# Patient Record
Sex: Male | Born: 1955 | Race: White | Hispanic: No | Marital: Married | Smoking: Never smoker
Health system: Southern US, Community
[De-identification: ages and names within clinical notes are randomized; demographics above are authoritative.]

## PROBLEM LIST (undated history)

## (undated) DIAGNOSIS — R972 Elevated prostate specific antigen [PSA]: Secondary | ICD-10-CM

## (undated) DIAGNOSIS — B009 Herpesviral infection, unspecified: Secondary | ICD-10-CM

## (undated) DIAGNOSIS — Z8719 Personal history of other diseases of the digestive system: Secondary | ICD-10-CM

## (undated) DIAGNOSIS — M5136 Other intervertebral disc degeneration, lumbar region: Secondary | ICD-10-CM

## (undated) DIAGNOSIS — K589 Irritable bowel syndrome without diarrhea: Secondary | ICD-10-CM

## (undated) DIAGNOSIS — N503 Cyst of epididymis: Secondary | ICD-10-CM

## (undated) DIAGNOSIS — T753XXA Motion sickness, initial encounter: Secondary | ICD-10-CM

## (undated) DIAGNOSIS — E785 Hyperlipidemia, unspecified: Secondary | ICD-10-CM

## (undated) DIAGNOSIS — F419 Anxiety disorder, unspecified: Secondary | ICD-10-CM

## (undated) DIAGNOSIS — K219 Gastro-esophageal reflux disease without esophagitis: Secondary | ICD-10-CM

## (undated) DIAGNOSIS — M545 Low back pain, unspecified: Secondary | ICD-10-CM

## (undated) DIAGNOSIS — M51369 Other intervertebral disc degeneration, lumbar region without mention of lumbar back pain or lower extremity pain: Secondary | ICD-10-CM

## (undated) DIAGNOSIS — F410 Panic disorder [episodic paroxysmal anxiety] without agoraphobia: Secondary | ICD-10-CM

## (undated) DIAGNOSIS — K750 Abscess of liver: Secondary | ICD-10-CM

## (undated) DIAGNOSIS — H269 Unspecified cataract: Secondary | ICD-10-CM

## (undated) DIAGNOSIS — Z974 Presence of external hearing-aid: Secondary | ICD-10-CM

## (undated) DIAGNOSIS — E669 Obesity, unspecified: Secondary | ICD-10-CM

## (undated) DIAGNOSIS — T7840XA Allergy, unspecified, initial encounter: Secondary | ICD-10-CM

## (undated) DIAGNOSIS — I10 Essential (primary) hypertension: Secondary | ICD-10-CM

## (undated) HISTORY — DX: Unspecified cataract: H26.9

## (undated) HISTORY — DX: Essential (primary) hypertension: I10

## (undated) HISTORY — PX: EYE SURGERY: SHX253

## (undated) HISTORY — PX: COLONOSCOPY: SHX174

## (undated) HISTORY — DX: Personal history of other diseases of the digestive system: Z87.19

## (undated) HISTORY — DX: Elevated prostate specific antigen (PSA): R97.20

## (undated) HISTORY — PX: ESOPHAGOGASTRODUODENOSCOPY: SHX1529

## (undated) HISTORY — DX: Gastro-esophageal reflux disease without esophagitis: K21.9

## (undated) HISTORY — PX: LIVER CYST REMOVAL: SHX5951

## (undated) HISTORY — DX: Low back pain: M54.5

## (undated) HISTORY — DX: Allergy, unspecified, initial encounter: T78.40XA

## (undated) HISTORY — DX: Obesity, unspecified: E66.9

## (undated) HISTORY — DX: Low back pain, unspecified: M54.50

---
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
## 2004-11-16 ENCOUNTER — Ambulatory Visit: Payer: Self-pay | Admitting: Internal Medicine

## 2004-11-24 ENCOUNTER — Ambulatory Visit: Payer: Self-pay | Admitting: Internal Medicine

## 2007-05-17 ENCOUNTER — Ambulatory Visit: Payer: Self-pay | Admitting: Gastroenterology

## 2007-07-12 ENCOUNTER — Encounter: Payer: Self-pay | Admitting: Family Medicine

## 2008-06-09 LAB — HM COLONOSCOPY

## 2009-01-22 ENCOUNTER — Encounter: Payer: Self-pay | Admitting: Family Medicine

## 2009-09-08 ENCOUNTER — Encounter: Payer: Self-pay | Admitting: Family Medicine

## 2009-10-07 ENCOUNTER — Ambulatory Visit: Payer: Self-pay | Admitting: Family Medicine

## 2009-10-07 DIAGNOSIS — K449 Diaphragmatic hernia without obstruction or gangrene: Secondary | ICD-10-CM | POA: Insufficient documentation

## 2009-10-07 DIAGNOSIS — K589 Irritable bowel syndrome without diarrhea: Secondary | ICD-10-CM | POA: Insufficient documentation

## 2009-10-07 DIAGNOSIS — I10 Essential (primary) hypertension: Secondary | ICD-10-CM | POA: Insufficient documentation

## 2009-10-07 DIAGNOSIS — N508 Other specified disorders of male genital organs: Secondary | ICD-10-CM | POA: Insufficient documentation

## 2009-10-08 LAB — CONVERTED CEMR LAB
ALT: 23 units/L (ref 0–53)
AST: 23 units/L (ref 0–37)
Albumin: 3.7 g/dL (ref 3.5–5.2)
Alkaline Phosphatase: 56 units/L (ref 39–117)
BUN: 20 mg/dL (ref 6–23)
Basophils Absolute: 0 10*3/uL (ref 0.0–0.1)
Basophils Relative: 0.1 % (ref 0.0–3.0)
Bilirubin, Direct: 0 mg/dL (ref 0.0–0.3)
CO2: 28 meq/L (ref 19–32)
Calcium: 8.8 mg/dL (ref 8.4–10.5)
Chloride: 102 meq/L (ref 96–112)
Creatinine, Ser: 1.1 mg/dL (ref 0.4–1.5)
Direct LDL: 123.7 mg/dL
Eosinophils Absolute: 0.2 10*3/uL (ref 0.0–0.7)
Eosinophils Relative: 3.5 % (ref 0.0–5.0)
GFR calc non Af Amer: 73.72 mL/min (ref >60)
Glucose, Bld: 92 mg/dL (ref 70–99)
HCT: 42.8 % (ref 39.0–52.0)
Hemoglobin: 14.6 g/dL (ref 13.0–17.0)
Lymphocytes Relative: 15.6 % (ref 12.0–46.0)
Lymphs Abs: 1 10*3/uL (ref 0.7–4.0)
MCHC: 34.1 g/dL (ref 30.0–36.0)
MCV: 85.6 fL (ref 78.0–100.0)
Monocytes Absolute: 0.5 10*3/uL (ref 0.1–1.0)
Monocytes Relative: 7.6 % (ref 3.0–12.0)
Neutro Abs: 5 10*3/uL (ref 1.4–7.7)
Neutrophils Relative %: 73.2 % (ref 43.0–77.0)
PSA: 2.16 ng/mL (ref 0.10–4.00)
Platelets: 217 10*3/uL (ref 150.0–400.0)
Potassium: 3.8 meq/L (ref 3.5–5.1)
RBC: 5 M/uL (ref 4.22–5.81)
RDW: 13.9 % (ref 11.5–14.6)
Sodium: 137 meq/L (ref 135–145)
TSH: 0.99 microintl units/mL (ref 0.35–5.50)
Total Bilirubin: 0.6 mg/dL (ref 0.3–1.2)
Total Protein: 6.8 g/dL (ref 6.0–8.3)
WBC: 6.7 10*3/uL (ref 4.5–10.5)

## 2009-11-03 ENCOUNTER — Telehealth (INDEPENDENT_AMBULATORY_CARE_PROVIDER_SITE_OTHER): Payer: Self-pay | Admitting: *Deleted

## 2010-09-14 ENCOUNTER — Encounter: Payer: Self-pay | Admitting: Family Medicine

## 2010-10-25 ENCOUNTER — Ambulatory Visit: Payer: Self-pay | Admitting: Internal Medicine

## 2010-10-25 NOTE — Letter (Signed)
Summary: Records Dated 07-10-07 thru 03-31-09/[facility]  Records Dated 07-10-07 thru 03-31-09/[facility]   Imported By: Lanelle Bal 05/26/2010 13:07:00  _____________________________________________________________________  External Attachment:    Type:   Image     Comment:   External Document

## 2010-10-25 NOTE — Assessment & Plan Note (Signed)
Summary: new patient r/s from 12/27/[facility]   Vital Signs:  Patient profile:   55 year old male Height:      66 inches Weight:      237.0 pounds BMI:     38.39 Temp:     98.2 degrees F oral Pulse rate:   80 / minute Pulse rhythm:   regular BP sitting:   140 / 90  (left arm) Cuff size:   regular  Vitals Entered By: Benny Lennert CMA Duncan Dull) (October 07, 2009 8:41 AM)  History of Present Illness: Chief complaint new patient to be established   55 year old male:  Lisinopril - gets a cough. Has ACE cough.  Sees Stefani Dama once a year, PSA was 2.7 this last year. 08/2009.  HTN: off all meds now, needs to restart and change from ACE.   Needs repeat PSA, screening chol, screen for DM.   Otherwise, generally he is feeling good     Preventive Screening-Counseling & Management  Alcohol-Tobacco     Smoking Status: never  Caffeine-Diet-Exercise     Does Patient Exercise: no      Drug Use:  no.    Allergies (verified): 1)  Ace Inhibitors  Past History:  Past Medical History: EPIDIDYMAL CYST  HIATAL HERNIA WITH REFLUX  FAMILY HISTORY OF COLON CA 1ST DEGREE RELATIVE IRRITABLE BOWEL SYNDROME ARTERIOVENOUS MALFORMATIONS      Past Surgical History: tonsils and adenoids 1963  Family History: Family History of Colon CA 1st degree relative <60 Family History High cholesterol Family History Hypertension  Social History: Occupation:Manager Married Never Smoked Alcohol use-no Drug use-no Regular exercise-no Occupation:  employed Smoking Status:  never Drug Use:  no Does Patient Exercise:  no  Review of Systems       ROS: GEN: No acute illnesses, no fevers, chills, sweats, fatigue, weight loss, or URI sx. GI: No n/v/d Pulm: No SOB, cough, wheezing Interactive and getting along well at home.  Otherwise, ROS is as per the HPI.   Physical Exam  Additional Exam:  GEN: WDWN, NAD, Non-toxic, A & O x 3 HEENT: Atraumatic, Normocephalic. Neck supple. No masses,  No LAD. Ears and Nose: No external deformity. CV: RRR, No M/G/R. No JVD. No thrill. No extra heart sounds. PULM: CTA B, no wheezes, crackles, rhonchi. No retractions. No resp. distress. No accessory muscle use. ABD: S, NT, ND, +BS. No rebound tenderness. No HSM.  EXTR: No c/c/e NEURO: Normal gait.  PSYCH: Normally interactive. Conversant. Not depressed or anxious appearing.  Calm demeanor.     Impression & Recommendations:  Problem # 1:  HYPERTENSION, ESSENTIAL NOS (ICD-401.9) Assessment New d/c ace, increase HCTZ  His updated medication list for this problem includes:    Hydrochlorothiazide 25 Mg Tabs (Hydrochlorothiazide) .Marland Kitchen... 1 by mouth daily  Problem # 2:  PROSTATE CANCER SCREEN (ICD-V76.44)  Orders: Venipuncture (43329) TLB-PSA (Prostate Specific Antigen) (84153-PSA)  Problem # 3:  CHOLESTEROL, SCREENING (ICD-V77.91)  Orders: Venipuncture (51884) TLB-Cholesterol, Direct LDL (83721-DIRLDL)  Problem # 4:  FATIGUE-MALAISE (ICD-780.79)  Orders: TLB-BMP (Basic Metabolic Panel-BMET) (80048-METABOL) TLB-CBC Platelet - w/Differential (85025-CBCD) TLB-Hepatic/Liver Function Pnl (80076-HEPATIC) TLB-TSH (Thyroid Stimulating Hormone) (84443-TSH)  Complete Medication List: 1)  Hydrochlorothiazide 25 Mg Tabs (Hydrochlorothiazide) .Marland Kitchen.. 1 by mouth daily 2)  Valacyclovir Hcl 1 Gm Tabs (Valacyclovir hcl) .... As needed for cold sores Prescriptions: HYDROCHLOROTHIAZIDE 25 MG TABS (HYDROCHLOROTHIAZIDE) 1 by mouth daily  #30 x 11   Entered and Authorized by:   Hannah Beat MD   Signed by:  Hannah Beat MD on 10/07/2009   Method used:   Print then Give to Patient   RxID:   [id number]   Current Allergies (reviewed today): ACE INHIBITORS   Flu Vaccine Result Date:  07/26/2009 Flu Vaccine Result:  given Colonoscopy Result Date:  09/25/2006 Colonoscopy Result:  normal

## 2010-10-25 NOTE — Progress Notes (Signed)
Summary: Health History Brought by Patient  Health History Brought by Patient   Imported By: Lanelle Bal 10/13/2009 09:59:37  _____________________________________________________________________  External Attachment:    Type:   Image     Comment:   External Document

## 2010-10-25 NOTE — Letter (Signed)
Summary: [facility]   Imported By: Lanelle Bal 10/11/2009 10:38:04  _____________________________________________________________________  External Attachment:    Type:   Image     Comment:   External Document

## 2010-10-25 NOTE — Progress Notes (Signed)
Summary: regarding pt's bill  Phone Note Call from Patient   Caller: Spouse- Rosey Bath [phone] Summary of Call: Pt was seen on january 13th for a new pt appt, but he also got his physical.  Pt hoped that this would be billed as a wellness exam, and it was not, so pt had to pay his $25.00 co- pay, which he wouldnt have had to pay if billed as a well visit.  Please advise pt's wife as to what is going on with this.  I'm sending this to Dr Patsy Lager because I dont have any knowledge of billing. Initial call taken by: Lowella Petties CMA,  November 03, 2009 1:18 PM  Follow-up for Phone Call        i did not do a physical on him - generally we do not do wellness exams on the initial office visit and much more is required to define and bill a wellness exam. I did not cover much of what is done in a physical.  i changed his blood pressure medication, drew some labwork, but did not do a complete wellness examl, so a new patient offive visit is the appropriate code and v70.0 cannot be billed here.  I am going to forward to [employer], who is our coder to review. I will also forward to Daine Gip, who runs billing for our office. Aram Beecham, if you can explain to patient's wife, I would appreciate it. Follow-up by: Hannah Beat MD,  November 04, 2009 2:59 PM  Additional Follow-up for Phone Call Additional follow up Details #1::        Sp w/ pt, says he is upset that he did not receive what he asked for. Told pt, he was a new pt., the doctor has to review his medical history, and discuss this info with the pt.  Pt feels he should get what he asked for, and because he was not told he would not recieve the cpx, he feels as if we should w/o the $25.00 copay due.  Pts ins pay 100% for a cpx.  Pt said he discussed the ekg and chest xrays w/ Dr. Patsy Lager, says Dr. Patsy Lager told him, he did not need one because of his age. Told pt I would disucss w/ Dr. Patsy Lager .Marland KitchenLorain Childes to [facility] Additional Follow-up by: Daine Gip,  November 15, 2009 1:49 PM    Additional Follow-up for Phone Call Additional follow up Details #2::    I did not do a complete physical.  My billing is correct. This was a new patient visit. All noted above--our coding and billing department is more than welcome to review my note to make sure that I billed this correctly. Please ask them to do so. Follow-up by: Hannah Beat MD,  November 15, 2009 2:00 PM  Additional Follow-up for Phone Call Additional follow up Details #3:: Details for Additional Follow-up Action Taken: I had also spoke to patient and explained information stated above.   I have sent to coding/billing for follow up  and will call patient as well.  Pt had only called a few days ago and is very persistent and demanding regarding this billing issue.   States that he told the office how to code this when he came in for visit that day.  Upon researching, I found out that he has also been in contact with [employer] who had been reviewing with Lateef in coding and that both had already advised patient that documentaiton and charges appeared correct.  I will call pt today.     Additional Follow-up by: Clarisa Schools,  November 15, 2009 4:32 PM

## 2010-10-27 ENCOUNTER — Inpatient Hospital Stay: Payer: Self-pay | Admitting: Internal Medicine

## 2010-10-27 ENCOUNTER — Ambulatory Visit: Payer: Self-pay | Admitting: Internal Medicine

## 2010-10-27 NOTE — Letter (Signed)
Summary: [facility]  [facility]   Imported By: Lanelle Bal 09/22/2010 08:45:47  _____________________________________________________________________  External Attachment:    Type:   Image     Comment:   External Document

## 2010-10-31 LAB — AFP TUMOR MARKER: AFP-Tumor Marker: 1.7 ng/mL (ref 0.0–8.3)

## 2010-10-31 LAB — CEA: CEA: 1.1 ng/mL (ref 0.0–4.7)

## 2010-11-07 ENCOUNTER — Ambulatory Visit: Payer: Self-pay | Admitting: Specialist

## 2010-11-18 ENCOUNTER — Ambulatory Visit: Payer: Self-pay | Admitting: Specialist

## 2011-05-27 DIAGNOSIS — F4322 Adjustment disorder with anxiety: Secondary | ICD-10-CM | POA: Insufficient documentation

## 2011-08-17 IMAGING — CT CT ABDOMEN W/O CM
2 of 3 series · 14 of 42 positions shown, 18 images · non-contrast
Comparison: none

REASON FOR EXAM: cavity gram, liver abscess
COMMENTS:

[Series 2: abd wo 5.0 i40f · axial · 0.97mm/px · z∈[-22,+248]mm · 11 of 63 slices shown, 15 images]
[im 6/63  soft-tissue]
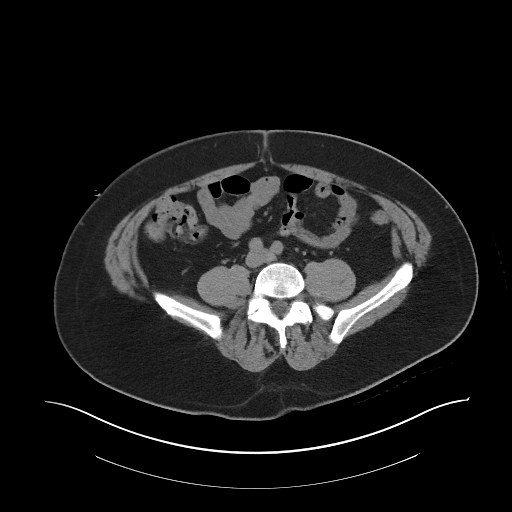
[im 6/63  bone]
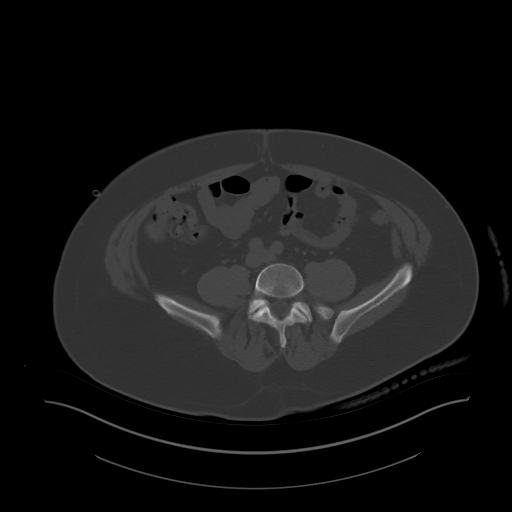
[im 12/63  soft-tissue]
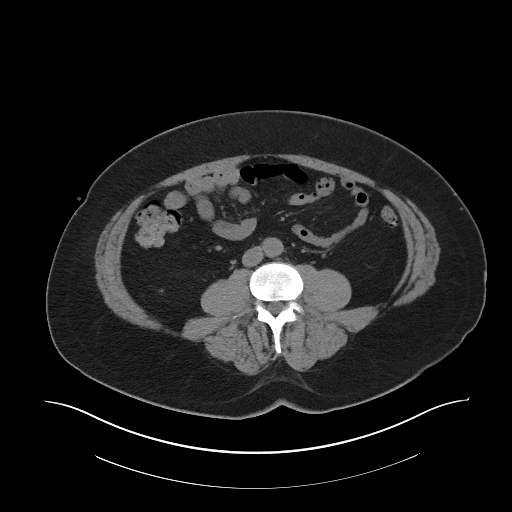
[im 18/63  soft-tissue]
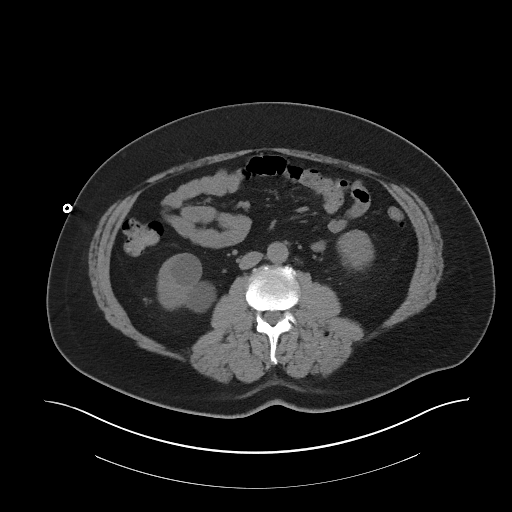
[im 24/63  soft-tissue]
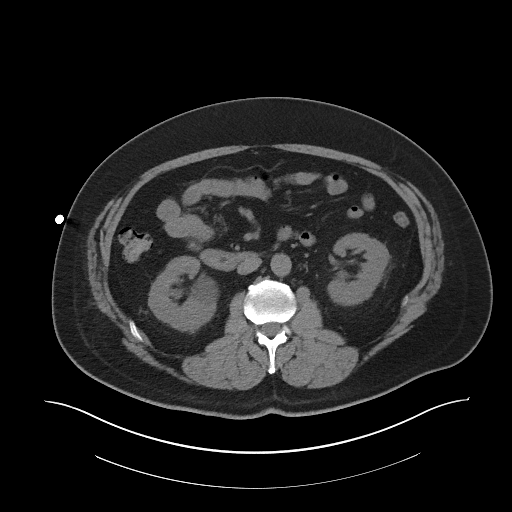
[im 33/63  soft-tissue]
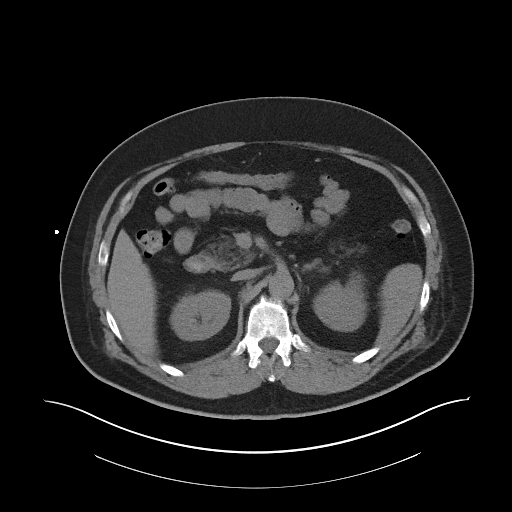
[im 39/63  soft-tissue]
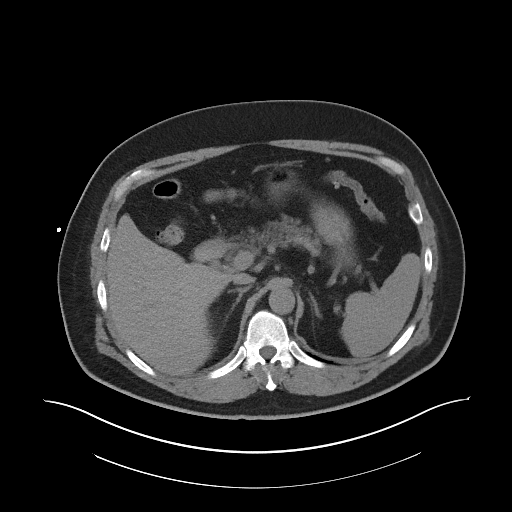
[im 45/63  soft-tissue]
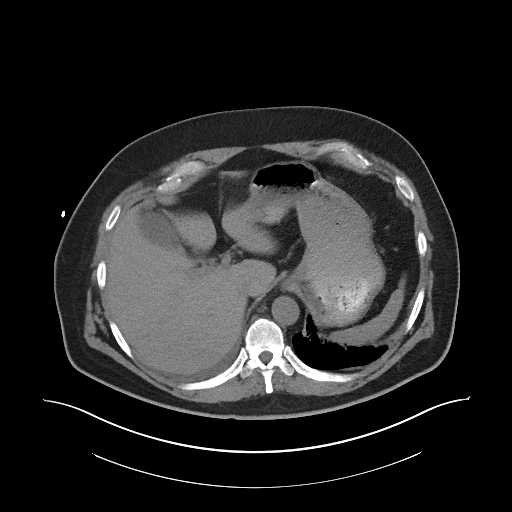
[im 51/63  soft-tissue]
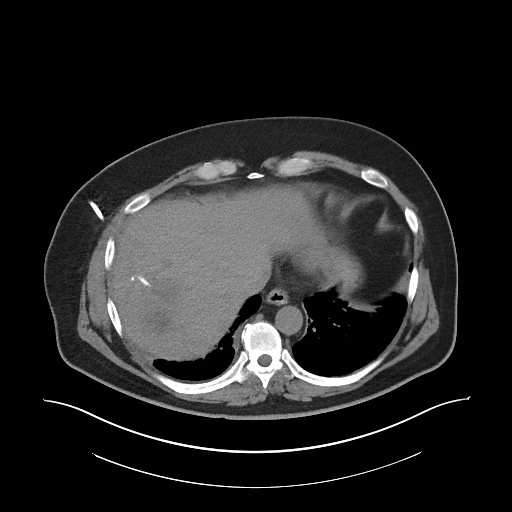
[im 51/63  lung]
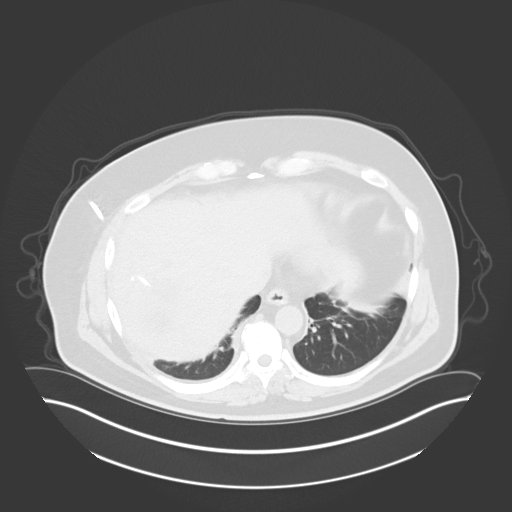
[im 54/63  lung]
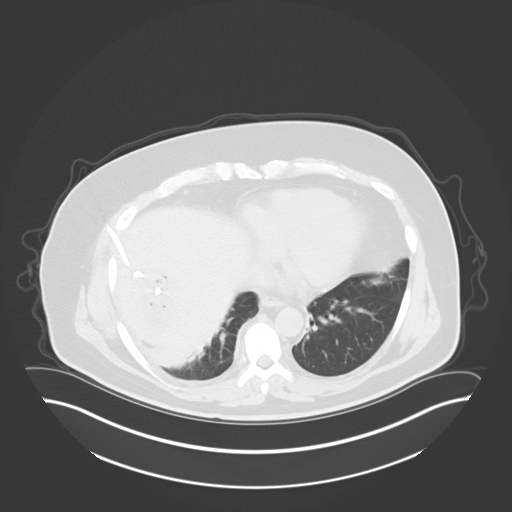
[im 57/63  soft-tissue]
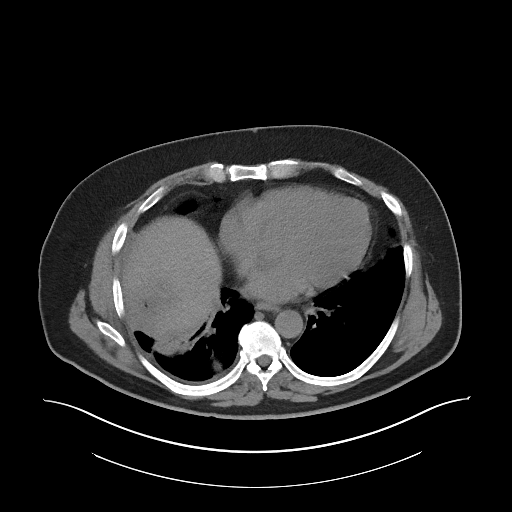
[im 57/63  lung]
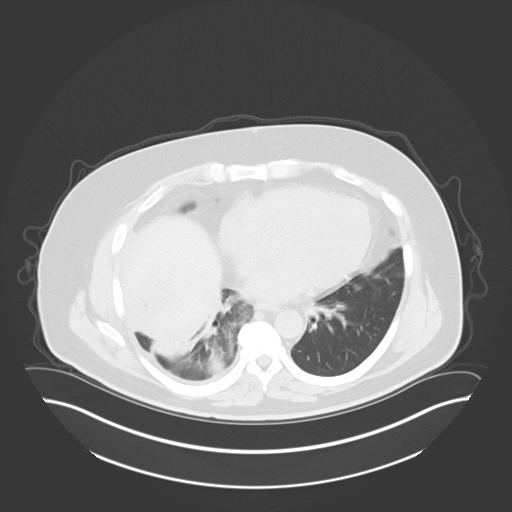
[im 57/63  bone]
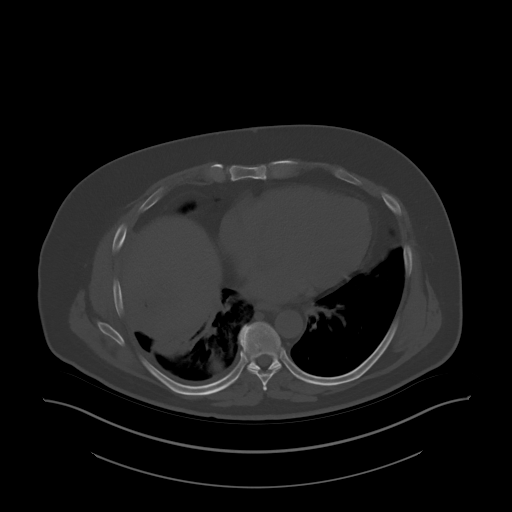
[im 60/63  lung]
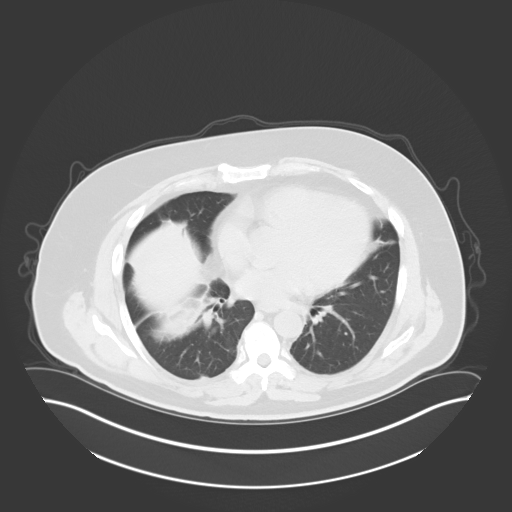

[Series 5: coronals · coronal · 0.65mm/px · 3 of 108 slices shown]
[im 36/108  soft-tissue]
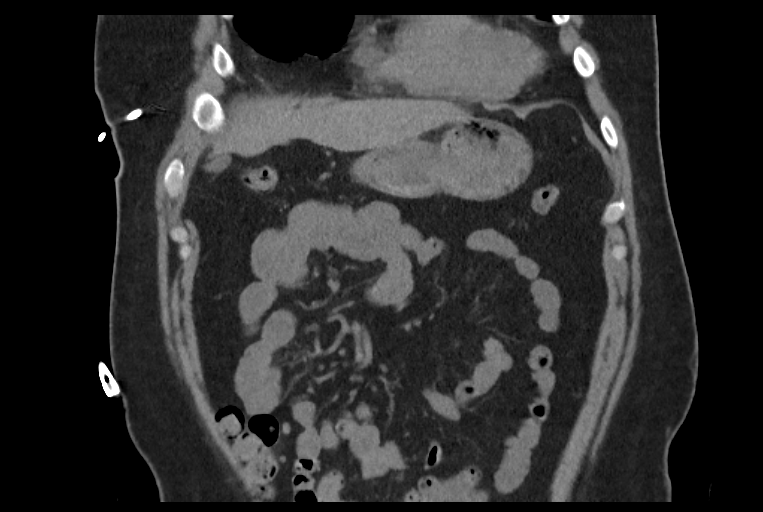
[im 48/108  soft-tissue]
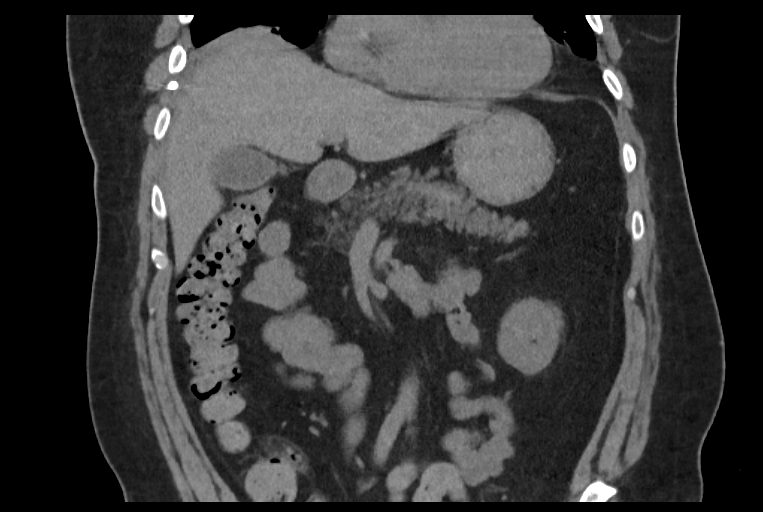
[im 60/108  soft-tissue]
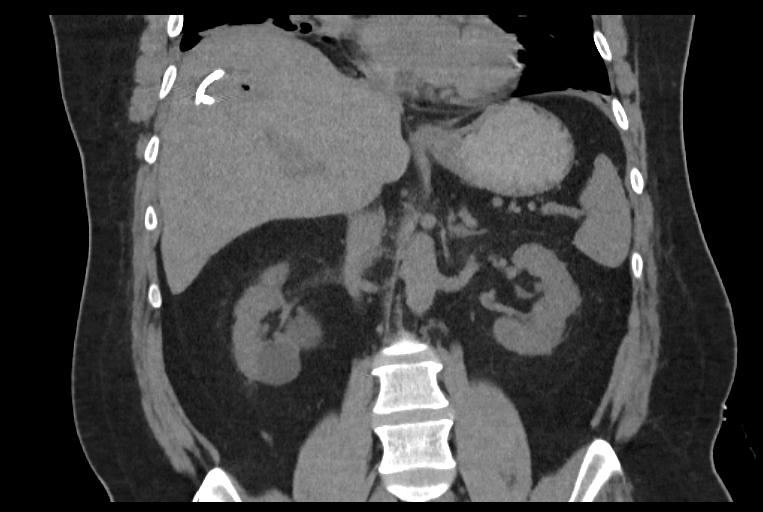

[14 of 42 positions shown; findings below may reference images not displayed]

PROCEDURE:     CT  - CT ABDOMEN STANDARD WO  - November 07, 2010 [DATE]

RESULT:     Standard nonenhanced CT of the liver was obtained pre- and post
saline administration into the liver abscess cavity. A liver abscess cavity
is well drained and has diminished in size from prior initial study of from
10/25/2010. Drainage is clear. The gallbladder is nondistended. The spleen is
normal. The pancreas is unremarkable. Renal cysts are present.
IMPRESSION: Improving liver abscess with good drainage.

## 2011-09-26 HISTORY — PX: PROSTATE BIOPSY: SHX241

## 2011-12-07 DIAGNOSIS — F410 Panic disorder [episodic paroxysmal anxiety] without agoraphobia: Secondary | ICD-10-CM | POA: Insufficient documentation

## 2011-12-07 DIAGNOSIS — E7800 Pure hypercholesterolemia, unspecified: Secondary | ICD-10-CM | POA: Insufficient documentation

## 2012-06-19 DIAGNOSIS — R3129 Other microscopic hematuria: Secondary | ICD-10-CM | POA: Insufficient documentation

## 2012-06-19 DIAGNOSIS — N529 Male erectile dysfunction, unspecified: Secondary | ICD-10-CM | POA: Insufficient documentation

## 2012-06-19 DIAGNOSIS — N4340 Spermatocele of epididymis, unspecified: Secondary | ICD-10-CM | POA: Insufficient documentation

## 2012-06-19 DIAGNOSIS — R972 Elevated prostate specific antigen [PSA]: Secondary | ICD-10-CM | POA: Insufficient documentation

## 2012-07-13 DIAGNOSIS — D400 Neoplasm of uncertain behavior of prostate: Secondary | ICD-10-CM | POA: Insufficient documentation

## 2013-06-05 ENCOUNTER — Encounter: Payer: Self-pay | Admitting: *Deleted

## 2013-06-09 ENCOUNTER — Ambulatory Visit (INDEPENDENT_AMBULATORY_CARE_PROVIDER_SITE_OTHER): Payer: 59 | Admitting: Internal Medicine

## 2013-06-09 ENCOUNTER — Ambulatory Visit: Payer: Self-pay | Admitting: Internal Medicine

## 2013-06-09 ENCOUNTER — Encounter: Payer: Self-pay | Admitting: Internal Medicine

## 2013-06-09 VITALS — BP 130/90 | HR 94 | Temp 98.3°F | Ht 67.0 in | Wt 259.0 lb

## 2013-06-09 DIAGNOSIS — B354 Tinea corporis: Secondary | ICD-10-CM

## 2013-06-09 DIAGNOSIS — E785 Hyperlipidemia, unspecified: Secondary | ICD-10-CM

## 2013-06-09 DIAGNOSIS — I10 Essential (primary) hypertension: Secondary | ICD-10-CM

## 2013-06-09 LAB — COMPREHENSIVE METABOLIC PANEL
ALT: 21 U/L (ref 0–53)
AST: 17 U/L (ref 0–37)
Albumin: 3.8 g/dL (ref 3.5–5.2)
Alkaline Phosphatase: 63 U/L (ref 39–117)
BUN: 22 mg/dL (ref 6–23)
CO2: 27 mEq/L (ref 19–32)
Calcium: 9.2 mg/dL (ref 8.4–10.5)
Chloride: 105 mEq/L (ref 96–112)
Creatinine, Ser: 1.2 mg/dL (ref 0.4–1.5)
GFR: 68.44 mL/min (ref >60.00)
Glucose, Bld: 91 mg/dL (ref 70–99)
Potassium: 3.6 mEq/L (ref 3.5–5.1)
Sodium: 138 mEq/L (ref 135–145)
Total Bilirubin: 0.6 mg/dL (ref 0.3–1.2)
Total Protein: 7 g/dL (ref 6.0–8.3)

## 2013-06-09 LAB — LIPID PANEL
Cholesterol: 195 mg/dL (ref 0–200)
HDL: 44.1 mg/dL (ref >39.00)
LDL Cholesterol: 128 mg/dL — ABNORMAL HIGH (ref 0–99)
Total CHOL/HDL Ratio: 4
Triglycerides: 116 mg/dL (ref 0.0–149.0)
VLDL: 23.2 mg/dL (ref 0.0–40.0)

## 2013-06-09 LAB — MICROALBUMIN / CREATININE URINE RATIO
Creatinine,U: 217.5 mg/dL
Microalb Creat Ratio: 0.2 mg/g (ref 0.0–30.0)
Microalb, Ur: 0.4 mg/dL (ref 0.0–1.9)

## 2013-06-09 MED ORDER — LOSARTAN POTASSIUM-HCTZ 100-25 MG PO TABS: 1.0000 | ORAL_TABLET | Freq: Every day | ORAL | Status: DC

## 2013-06-09 MED ORDER — VALACYCLOVIR HCL 500 MG PO TABS: 500.0000 mg | ORAL_TABLET | Freq: Two times a day (BID) | ORAL | Status: DC | PRN

## 2013-06-09 MED ORDER — CYCLOBENZAPRINE HCL 10 MG PO TABS: 10.0000 mg | ORAL_TABLET | Freq: Three times a day (TID) | ORAL | Status: DC | PRN

## 2013-06-09 MED ORDER — AMLODIPINE BESYLATE 5 MG PO TABS: 5.0000 mg | ORAL_TABLET | Freq: Every day | ORAL | Status: DC

## 2013-06-09 MED ORDER — DICLOFENAC SODIUM 3 % TD GEL: 1.0000 "application " | TRANSDERMAL | Status: DC | PRN

## 2013-06-09 NOTE — Progress Notes (Signed)
Subjective:    Patient ID: Scott Crawford, male    DOB: 1956/08/14, 57 y.o.   MRN: [id number]  HPI 57 year old male with history of hypertension, obesity presents to establish care. He reports that he is generally feeling well. He is compliant with medication. He denies any recent chest pain, palpitations, headache. He tries to follow a healthy diet. He does not get regular exercise as he has limited time. He is the primary caregiver for his wife who is disabled from a Wegener's granulomatosis. He retired from his job in order to care for her.  His only concern today is several week history of scaling rash over his left flank. He is scheduled to followup with his dermatologist in a few weeks. The rash is not generally itchy. He denies use of any new lotions or creams. He denies use of any new soaps or perfumes.  Outpatient Encounter Prescriptions as of 06/09/2013  Medication Sig Dispense Refill  . amLODipine (NORVASC) 5 MG tablet Take 1 tablet (5 mg total) by mouth daily.  30 tablet  11  . cyclobenzaprine (FLEXERIL) 10 MG tablet Take 1 tablet (10 mg total) by mouth 3 (three) times daily as needed for muscle spasms.  90 tablet  3  . Diclofenac Sodium (SOLARAZE) 3 % GEL Place 1 application onto the skin as needed.  100 g  6  . finasteride (PROSCAR) 5 MG tablet       . losartan-hydrochlorothiazide (HYZAAR) 100-25 MG per tablet Take 1 tablet by mouth daily.  30 tablet  11  . valACYclovir (VALTREX) 500 MG tablet Take 1 tablet (500 mg total) by mouth 2 (two) times daily as needed (As needed for cold sores).  30 tablet  11   No facility-administered encounter medications on file as of 06/09/2013.   BP 130/90  Pulse 94  Temp(Src) 98.3 F (36.8 C) (Oral)  Ht 5\' 7"  (1.702 m)  Wt 259 lb (117.482 kg)  BMI 40.56 kg/m2  SpO2 95%  Review of Systems  Constitutional: Negative for fever, chills, activity change, appetite change, fatigue and unexpected weight change.  Eyes: Negative for visual  disturbance.  Respiratory: Negative for cough and shortness of breath.   Cardiovascular: Negative for chest pain, palpitations and leg swelling.  Gastrointestinal: Negative for abdominal pain and abdominal distention.  Genitourinary: Negative for dysuria, urgency and difficulty urinating.  Musculoskeletal: Negative for arthralgias and gait problem.  Skin: Positive for rash. Negative for color change.  Hematological: Negative for adenopathy.  Psychiatric/Behavioral: Negative for sleep disturbance and dysphoric mood. The patient is not nervous/anxious.        Objective:   Physical Exam  Constitutional: He is oriented to person, place, and time. He appears well-developed and well-nourished. No distress.  HENT:  Head: Normocephalic and atraumatic.  Right Ear: External ear normal.  Left Ear: External ear normal.  Nose: Nose normal.  Mouth/Throat: Oropharynx is clear and moist. No oropharyngeal exudate.  Eyes: Conjunctivae and EOM are normal. Pupils are equal, round, and reactive to light. Right eye exhibits no discharge. Left eye exhibits no discharge. No scleral icterus.  Neck: Normal range of motion. Neck supple. No tracheal deviation present. No thyromegaly present.  Cardiovascular: Normal rate, regular rhythm and normal heart sounds.  Exam reveals no gallop and no friction rub.   No murmur heard. Pulmonary/Chest: Effort normal and breath sounds normal. No respiratory distress. He has no wheezes. He has no rales. He exhibits no tenderness.  Abdominal: Soft. Bowel sounds are normal. He exhibits  no distension and no mass. There is no tenderness. There is no rebound and no guarding.  Musculoskeletal: Normal range of motion. He exhibits no edema.  Lymphadenopathy:    He has no cervical adenopathy.  Neurological: He is alert and oriented to person, place, and time. No cranial nerve deficit. Coordination normal.  Skin: Skin is warm and dry. Rash noted. Rash is maculopapular. He is not  diaphoretic. No erythema. No pallor.     Psychiatric: He has a normal mood and affect. His behavior is normal. Judgment and thought content normal.          Assessment & Plan:

## 2013-06-09 NOTE — Assessment & Plan Note (Signed)
Rash is most consistent with tinea. Recommended applying Lotrimin cream twice daily for the next couple of weeks. He has followup scheduled with his dermatologist if symptoms are not improving.

## 2013-06-09 NOTE — Assessment & Plan Note (Signed)
BP Readings from Last 3 Encounters:  06/09/13 130/90  10/07/09 140/90   Blood pressure generally well controlled at home, diastolic slightly elevated today. We'll continue to monitor. Patient will call if blood pressure consistently greater than 140/90. Will check renal function with labs today. Follow up 6 months or sooner as needed.

## 2013-06-09 NOTE — Assessment & Plan Note (Signed)
Wt Readings from Last 3 Encounters:  06/09/13 259 lb (117.482 kg)  10/07/09 237 lb (107.502 kg)   Body mass index is 40.56 kg/(m^2).  Encouraged continued efforts at healthy diet. Encouraged regular physical activity as much is possible understanding limitations given that he is the primary caregiver for his wife.

## 2013-06-11 ENCOUNTER — Encounter: Payer: Self-pay | Admitting: Internal Medicine

## 2013-07-28 ENCOUNTER — Ambulatory Visit: Payer: 59

## 2013-11-26 ENCOUNTER — Encounter: Payer: Self-pay | Admitting: *Deleted

## 2013-12-09 ENCOUNTER — Encounter: Payer: 59 | Admitting: Internal Medicine

## 2013-12-12 ENCOUNTER — Ambulatory Visit (INDEPENDENT_AMBULATORY_CARE_PROVIDER_SITE_OTHER): Payer: 59 | Admitting: Internal Medicine

## 2013-12-12 ENCOUNTER — Encounter: Payer: Self-pay | Admitting: Internal Medicine

## 2013-12-12 VITALS — BP 102/72 | HR 93 | Temp 98.1°F | Ht 67.0 in | Wt 261.0 lb

## 2013-12-12 DIAGNOSIS — Z0000 Encounter for general adult medical examination without abnormal findings: Secondary | ICD-10-CM

## 2013-12-12 DIAGNOSIS — I10 Essential (primary) hypertension: Secondary | ICD-10-CM

## 2013-12-12 DIAGNOSIS — Z23 Encounter for immunization: Secondary | ICD-10-CM

## 2013-12-12 LAB — LIPID PANEL
Cholesterol: 202 mg/dL — ABNORMAL HIGH (ref 0–200)
HDL: 35.6 mg/dL — ABNORMAL LOW (ref >39.00)
LDL Cholesterol: 121 mg/dL — ABNORMAL HIGH (ref 0–99)
Total CHOL/HDL Ratio: 6
Triglycerides: 227 mg/dL — ABNORMAL HIGH (ref 0.0–149.0)
VLDL: 45.4 mg/dL — ABNORMAL HIGH (ref 0.0–40.0)

## 2013-12-12 LAB — COMPREHENSIVE METABOLIC PANEL
ALT: 22 U/L (ref 0–53)
AST: 17 U/L (ref 0–37)
Albumin: 3.9 g/dL (ref 3.5–5.2)
Alkaline Phosphatase: 72 U/L (ref 39–117)
BUN: 22 mg/dL (ref 6–23)
CO2: 27 mEq/L (ref 19–32)
Calcium: 9.6 mg/dL (ref 8.4–10.5)
Chloride: 104 mEq/L (ref 96–112)
Creatinine, Ser: 1.2 mg/dL (ref 0.4–1.5)
GFR: 65.08 mL/min (ref >60.00)
Glucose, Bld: 90 mg/dL (ref 70–99)
Potassium: 3.7 mEq/L (ref 3.5–5.1)
Sodium: 141 mEq/L (ref 135–145)
Total Bilirubin: 0.5 mg/dL (ref 0.3–1.2)
Total Protein: 7.1 g/dL (ref 6.0–8.3)

## 2013-12-12 LAB — CBC WITH DIFFERENTIAL/PLATELET
Basophils Absolute: 0.1 10*3/uL (ref 0.0–0.1)
Basophils Relative: 0.5 % (ref 0.0–3.0)
Eosinophils Absolute: 0.3 10*3/uL (ref 0.0–0.7)
Eosinophils Relative: 3.6 % (ref 0.0–5.0)
HCT: 43.7 % (ref 39.0–52.0)
Hemoglobin: 14.3 g/dL (ref 13.0–17.0)
Lymphocytes Relative: 16.8 % (ref 12.0–46.0)
Lymphs Abs: 1.6 10*3/uL (ref 0.7–4.0)
MCHC: 32.8 g/dL (ref 30.0–36.0)
MCV: 84.5 fl (ref 78.0–100.0)
Monocytes Absolute: 0.9 10*3/uL (ref 0.1–1.0)
Monocytes Relative: 8.9 % (ref 3.0–12.0)
Neutro Abs: 6.9 10*3/uL (ref 1.4–7.7)
Neutrophils Relative %: 70.2 % (ref 43.0–77.0)
Platelets: 329 10*3/uL (ref 150.0–400.0)
RBC: 5.17 Mil/uL (ref 4.22–5.81)
RDW: 15.1 % — ABNORMAL HIGH (ref 11.5–14.6)
WBC: 9.8 10*3/uL (ref 4.5–10.5)

## 2013-12-12 LAB — MICROALBUMIN / CREATININE URINE RATIO
Creatinine,U: 180.8 mg/dL
Microalb Creat Ratio: 0.2 mg/g (ref 0.0–30.0)
Microalb, Ur: 0.3 mg/dL (ref 0.0–1.9)

## 2013-12-12 NOTE — Progress Notes (Signed)
Subjective:    Patient ID: Scott Crawford, male    DOB: 14-Jul-1956, 58 y.o.   MRN: [id number]  HPI 58YO male presents for annual exam. He is generally feeling well. No concerns today. He would like to get Pneumovax vaccine today, as he had Prevnar last year. He continues to care for his wife, who has chronic illness. He notes limited time for exercise. He tries to follow a healthy diet.  Outpatient Encounter Prescriptions as of 12/12/2013  Medication Sig  . amLODipine (NORVASC) 5 MG tablet Take 1 tablet (5 mg total) by mouth daily.  . cyclobenzaprine (FLEXERIL) 10 MG tablet Take 1 tablet (10 mg total) by mouth 3 (three) times daily as needed for muscle spasms.  . Diclofenac Sodium (SOLARAZE) 3 % GEL Place 1 application onto the skin as needed.  . finasteride (PROSCAR) 5 MG tablet   . losartan-hydrochlorothiazide (HYZAAR) 100-25 MG per tablet Take 1 tablet by mouth daily.  . valACYclovir (VALTREX) 500 MG tablet Take 1 tablet (500 mg total) by mouth 2 (two) times daily as needed (As needed for cold sores).     Review of Systems  Constitutional: Negative for fever, chills, activity change, appetite change, fatigue and unexpected weight change.  Eyes: Negative for visual disturbance.  Respiratory: Negative for cough and shortness of breath.   Cardiovascular: Negative for chest pain, palpitations and leg swelling.  Gastrointestinal: Negative for abdominal pain and abdominal distention.  Genitourinary: Negative for dysuria, urgency and difficulty urinating.  Musculoskeletal: Negative for arthralgias and gait problem.  Skin: Negative for color change and rash.  Hematological: Negative for adenopathy.  Psychiatric/Behavioral: Negative for sleep disturbance and dysphoric mood. The patient is not nervous/anxious.        Objective:    BP 102/72  Pulse 93  Temp(Src) 98.1 F (36.7 C) (Oral)  Ht 5\' 7"  (1.702 m)  Wt 261 lb (118.389 kg)  BMI 40.87 kg/m2  SpO2 97% Physical Exam    Constitutional: He is oriented to person, place, and time. He appears well-developed and well-nourished. No distress.  HENT:  Head: Normocephalic and atraumatic.  Right Ear: External ear normal.  Left Ear: External ear normal.  Nose: Nose normal.  Mouth/Throat: Oropharynx is clear and moist. No oropharyngeal exudate.  Eyes: Conjunctivae and EOM are normal. Pupils are equal, round, and reactive to light. Right eye exhibits no discharge. Left eye exhibits no discharge. No scleral icterus.  Neck: Normal range of motion. Neck supple. No tracheal deviation present. No thyromegaly present.  Cardiovascular: Normal rate, regular rhythm and normal heart sounds.  Exam reveals no gallop and no friction rub.   No murmur heard. Pulmonary/Chest: Effort normal and breath sounds normal. No accessory muscle usage. Not tachypneic. No respiratory distress. He has no decreased breath sounds. He has no wheezes. He has no rhonchi. He has no rales. He exhibits no tenderness.  Abdominal: Soft. Bowel sounds are normal. He exhibits no distension and no mass. There is no tenderness. There is no rebound and no guarding.  Musculoskeletal: Normal range of motion. He exhibits no edema.  Lymphadenopathy:    He has no cervical adenopathy.  Neurological: He is alert and oriented to person, place, and time. No cranial nerve deficit. Coordination normal.  Skin: Skin is warm and dry. No rash noted. He is not diaphoretic. No erythema. No pallor.  Psychiatric: He has a normal mood and affect. His behavior is normal. Judgment and thought content normal.  Assessment & Plan:   Problem List Items Addressed This Visit   HYPERTENSION, ESSENTIAL NOS      BP Readings from Last 3 Encounters:  12/12/13 102/72  06/09/13 130/90  10/07/09 140/90   BP well controlled on current medications. Will continue.    Routine general medical examination at a health care facility - Primary     General medical exam normal today.  Colonoscopy is UTD. Recent PSA testing normal. Immunizations are UTD except for Pneumovax and Tdap which were given today. Will check labs including CBC, CMP, lipids, urine microalbumin. Encouraged healthy diet and regular exercise.    Relevant Orders      CBC with Differential (Completed)      Comprehensive metabolic panel (Completed)      Lipid panel (Completed)      Microalbumin / creatinine urine ratio (Completed)      Vit D  25 hydroxy (rtn osteoporosis monitoring)      Fecal occult blood, imunochemical      Measles ([place]) Antibody IgG   Severe obesity (BMI >= 40)      Wt Readings from Last 3 Encounters:  12/12/13 261 lb (118.389 kg)  06/09/13 259 lb (117.482 kg)  10/07/09 237 lb (107.502 kg)   Encouraged healthy diet and regular exercise with goal of weight loss.     Other Visit Diagnoses   Need for prophylactic vaccination against Streptococcus pneumoniae (pneumococcus)        Relevant Orders       Pneumococcal polysaccharide vaccine 23-valent greater than or equal to 2yo subcutaneous/IM (Completed)       Varicella zoster antibody, IgG       Varicella zoster antibody, IgM    Need for diphtheria-tetanus-pertussis (Tdap) vaccine, adult/adolescent        Relevant Orders       Tdap vaccine greater than or equal to 7yo IM (Completed)        Return in about 6 months (around 06/14/2014) for Recheck.

## 2013-12-12 NOTE — Assessment & Plan Note (Signed)
General medical exam normal today. Colonoscopy is UTD. Recent PSA testing normal. Immunizations are UTD except for Pneumovax and Tdap which were given today. Will check labs including CBC, CMP, lipids, urine microalbumin. Encouraged healthy diet and regular exercise.

## 2013-12-12 NOTE — Progress Notes (Signed)
Pre visit review using our clinic review tool, if applicable. No additional management support is needed unless otherwise documented below in the visit note. 

## 2013-12-12 NOTE — Assessment & Plan Note (Signed)
Wt Readings from Last 3 Encounters:  12/12/13 261 lb (118.389 kg)  06/09/13 259 lb (117.482 kg)  10/07/09 237 lb (107.502 kg)   Encouraged healthy diet and regular exercise with goal of weight loss.

## 2013-12-12 NOTE — Assessment & Plan Note (Signed)
BP Readings from Last 3 Encounters:  12/12/13 102/72  06/09/13 130/90  10/07/09 140/90   BP well controlled on current medications. Will continue.

## 2013-12-12 NOTE — Patient Instructions (Signed)
Look into Cologuard coverage

## 2013-12-13 LAB — VITAMIN D 25 HYDROXY (VIT D DEFICIENCY, FRACTURES): Vit D, 25-Hydroxy: 38 ng/mL (ref 30–89)

## 2013-12-13 LAB — VARICELLA ZOSTER ANTIBODY, IGG: Varicella IgG: 1181 Index — ABNORMAL HIGH (ref <135.00)

## 2013-12-13 LAB — RUBEOLA ANTIBODY IGG: Rubeola IgG: >300 AU/mL — ABNORMAL HIGH (ref <25.00)

## 2013-12-15 ENCOUNTER — Telehealth: Payer: Self-pay | Admitting: *Deleted

## 2013-12-15 ENCOUNTER — Telehealth: Payer: Self-pay | Admitting: Internal Medicine

## 2013-12-15 NOTE — Telephone Encounter (Signed)
Relevant patient education assigned to patient using Emmi. ° °

## 2013-12-15 NOTE — Telephone Encounter (Signed)
Patient left voicemail stating he did contact his insurance company in reference to the [employer]. In order for them to proceed any further he will need diagnosis code to provide to the insurance company.

## 2013-12-15 NOTE — Telephone Encounter (Signed)
Yes, the form does not have the CPT code, so I forwarded the message to [facility] to figure out the CPT coding.

## 2013-12-16 LAB — VARICELLA ZOSTER ANTIBODY, IGM: Varicella Zoster Ab IgM: 0.09 {ISR} (ref <0.91)

## 2013-12-17 ENCOUNTER — Encounter: Payer: Self-pay | Admitting: Internal Medicine

## 2014-01-01 ENCOUNTER — Telehealth: Payer: Self-pay | Admitting: Internal Medicine

## 2014-01-01 NOTE — Telephone Encounter (Signed)
Please call pt he is upset that it has taken so long to get this information  From [name], MD [[id number]]  To [name] Composed 12/17/2013 9:10 PM  For Delivery On 12/17/2013 9:10 PM  Subject RE: Non-Urgent Medical Question Message Type  Patient Medical Advice Request Read Status Y By [name] - last viewed at 10:35 PM on 12/17/2013  Message Body Yes, I have asked our office manager to try to get the CPT code from the company. She will contact you with this as soon as she has it.   ----- Message -----  From: [name]  Sent: 12/17/2013 9:50 AM EDT  To: [name], MD  Subject: Non-Urgent Medical Question   Second request for info..   To get approval for the Cologuard test I need to provide the insurance company with the procedure code..   Thanks, Scott Crawford

## 2014-01-02 NOTE — Telephone Encounter (Signed)
Per Manuela Schwartz the CPT for Cologuard is [phone]. Patient is aware of the code. He did say that his insurance company has tried contacting our office in regards to this code. I don't have any messages from them. I did apologize to the patient for it taken so long to get this information for him. He said thank you and he was glad that he finally had the code.

## 2014-06-13 ENCOUNTER — Other Ambulatory Visit: Payer: Self-pay | Admitting: Internal Medicine

## 2014-06-18 ENCOUNTER — Ambulatory Visit (INDEPENDENT_AMBULATORY_CARE_PROVIDER_SITE_OTHER): Payer: 59 | Admitting: Internal Medicine

## 2014-06-18 ENCOUNTER — Encounter: Payer: Self-pay | Admitting: Internal Medicine

## 2014-06-18 VITALS — BP 116/74 | HR 82 | Temp 98.4°F | Ht 67.0 in | Wt 254.5 lb

## 2014-06-18 DIAGNOSIS — I10 Essential (primary) hypertension: Secondary | ICD-10-CM

## 2014-06-18 LAB — COMPREHENSIVE METABOLIC PANEL
ALT: 17 U/L (ref 0–53)
AST: 17 U/L (ref 0–37)
Albumin: 3.9 g/dL (ref 3.5–5.2)
Alkaline Phosphatase: 73 U/L (ref 39–117)
BUN: 27 mg/dL — ABNORMAL HIGH (ref 6–23)
CO2: 31 mEq/L (ref 19–32)
Calcium: 9.4 mg/dL (ref 8.4–10.5)
Chloride: 103 mEq/L (ref 96–112)
Creatinine, Ser: 1.2 mg/dL (ref 0.4–1.5)
GFR: 64.35 mL/min (ref >60.00)
Glucose, Bld: 104 mg/dL — ABNORMAL HIGH (ref 70–99)
Potassium: 3.6 mEq/L (ref 3.5–5.1)
Sodium: 139 mEq/L (ref 135–145)
Total Bilirubin: 0.3 mg/dL (ref 0.2–1.2)
Total Protein: 7.4 g/dL (ref 6.0–8.3)

## 2014-06-18 MED ORDER — AMLODIPINE BESYLATE 5 MG PO TABS: ORAL_TABLET | ORAL | Status: DC

## 2014-06-18 MED ORDER — LOSARTAN POTASSIUM-HCTZ 100-25 MG PO TABS: ORAL_TABLET | ORAL | Status: DC

## 2014-06-18 NOTE — Assessment & Plan Note (Signed)
BP Readings from Last 3 Encounters:  06/18/14 116/74  12/12/13 102/72  06/09/13 130/90   BP well controlled. Will try tapering down on Amlodipine to 2.5mg  daily. He will call or email with BP readings. Renal function with labs today.

## 2014-06-18 NOTE — Progress Notes (Signed)
Pre visit review using our clinic review tool, if applicable. No additional management support is needed unless otherwise documented below in the visit note. 

## 2014-06-18 NOTE — Assessment & Plan Note (Signed)
Wt Readings from Last 3 Encounters:  06/18/14 254 lb 8 oz (115.44 kg)  12/12/13 261 lb (118.389 kg)  06/09/13 259 lb (117.482 kg)   Body mass index is 39.85 kg/(m^2). Encouraged continued effort at weight loss with healthy diet and exercise.

## 2014-06-18 NOTE — Progress Notes (Signed)
Subjective:    Patient ID: Scott Crawford, male    DOB: 1955-12-23, 58 y.o.   MRN: [id number]  HPI 58YO male presents for follow up. Feeling well. Has been limiting portion size in effort to lose weight. Has lost 7lbs. BP typically near 100/70s recently. Compliant with medication. Had one fleeting episode of LLQ abdominal pain a few weeks ago after eating goat cheese, however no recurrent symptoms.  Review of Systems  Constitutional: Negative for fever, chills, activity change, appetite change, fatigue and unexpected weight change.  Eyes: Negative for visual disturbance.  Respiratory: Negative for cough and shortness of breath.   Cardiovascular: Negative for chest pain, palpitations and leg swelling.  Gastrointestinal: Negative for nausea, vomiting, abdominal pain, diarrhea, constipation and abdominal distention.  Genitourinary: Negative for dysuria, urgency and difficulty urinating.  Musculoskeletal: Negative for arthralgias, gait problem, myalgias and neck pain.  Skin: Negative for color change and rash.  Hematological: Negative for adenopathy.  Psychiatric/Behavioral: Negative for sleep disturbance and dysphoric mood. The patient is not nervous/anxious.        Objective:    BP 116/74  Pulse 82  Temp(Src) 98.4 F (36.9 C) (Oral)  Ht 5\' 7"  (1.702 m)  Wt 254 lb 8 oz (115.44 kg)  BMI 39.85 kg/m2  SpO2 97% Physical Exam  Constitutional: He is oriented to person, place, and time. He appears well-developed and well-nourished. No distress.  HENT:  Head: Normocephalic and atraumatic.  Right Ear: External ear normal.  Left Ear: External ear normal.  Nose: Nose normal.  Mouth/Throat: Oropharynx is clear and moist. No oropharyngeal exudate.  Eyes: Conjunctivae and EOM are normal. Pupils are equal, round, and reactive to light. Right eye exhibits no discharge. Left eye exhibits no discharge. No scleral icterus.  Neck: Normal range of motion. Neck supple. No tracheal deviation  present. No thyromegaly present.  Cardiovascular: Normal rate, regular rhythm and normal heart sounds.  Exam reveals no gallop and no friction rub.   No murmur heard. Pulmonary/Chest: Effort normal and breath sounds normal. No accessory muscle usage. Not tachypneic. No respiratory distress. He has no decreased breath sounds. He has no wheezes. He has no rhonchi. He has no rales. He exhibits no tenderness.  Abdominal: Soft. Bowel sounds are normal. He exhibits no distension and no mass. There is no tenderness. There is no rebound and no guarding.  Musculoskeletal: Normal range of motion. He exhibits no edema.  Lymphadenopathy:    He has no cervical adenopathy.  Neurological: He is alert and oriented to person, place, and time. No cranial nerve deficit. Coordination normal.  Skin: Skin is warm and dry. No rash noted. He is not diaphoretic. No erythema. No pallor.  Psychiatric: He has a normal mood and affect. His behavior is normal. Judgment and thought content normal.          Assessment & Plan:   Problem List Items Addressed This Visit     Unprioritized   HYPERTENSION, ESSENTIAL NOS - Primary      BP Readings from Last 3 Encounters:  06/18/14 116/74  12/12/13 102/72  06/09/13 130/90   BP well controlled. Will try tapering down on Amlodipine to 2.5mg  daily. He will call or email with BP readings. Renal function with labs today.    Relevant Medications      losartan-hydrochlorothiazide (HYZAAR) 100-25 MG per tablet      amLODIpine (NORVASC) tablet   Other Relevant Orders      Comprehensive metabolic panel   Severe obesity (BMI >=  40)      Wt Readings from Last 3 Encounters:  06/18/14 254 lb 8 oz (115.44 kg)  12/12/13 261 lb (118.389 kg)  06/09/13 259 lb (117.482 kg)   Body mass index is 39.85 kg/(m^2). Encouraged continued effort at weight loss with healthy diet and exercise.        Return in about 6 months (around 12/17/2014) for Wellness Visit.

## 2014-06-18 NOTE — Patient Instructions (Addendum)
Try reducing dose of Amlodipine to 2.5mg  daily. Monitor blood pressure and email with readings.

## 2014-06-24 ENCOUNTER — Ambulatory Visit: Payer: Self-pay | Admitting: Ophthalmology

## 2014-07-10 ENCOUNTER — Other Ambulatory Visit: Payer: Self-pay

## 2014-12-18 ENCOUNTER — Encounter: Payer: 59 | Admitting: Internal Medicine

## 2015-02-25 ENCOUNTER — Encounter: Payer: Self-pay | Admitting: Internal Medicine

## 2015-02-25 ENCOUNTER — Ambulatory Visit (INDEPENDENT_AMBULATORY_CARE_PROVIDER_SITE_OTHER): Payer: BLUE CROSS/BLUE SHIELD | Admitting: Internal Medicine

## 2015-02-25 VITALS — BP 108/69 | HR 71 | Temp 98.2°F | Ht 67.25 in | Wt 237.0 lb

## 2015-02-25 DIAGNOSIS — Z0000 Encounter for general adult medical examination without abnormal findings: Secondary | ICD-10-CM

## 2015-02-25 DIAGNOSIS — Z800 Family history of malignant neoplasm of digestive organs: Secondary | ICD-10-CM | POA: Diagnosis not present

## 2015-02-25 DIAGNOSIS — I10 Essential (primary) hypertension: Secondary | ICD-10-CM | POA: Diagnosis not present

## 2015-02-25 DIAGNOSIS — E669 Obesity, unspecified: Secondary | ICD-10-CM | POA: Diagnosis not present

## 2015-02-25 LAB — COMPREHENSIVE METABOLIC PANEL
ALT: 13 U/L (ref 0–53)
AST: 15 U/L (ref 0–37)
Albumin: 4.2 g/dL (ref 3.5–5.2)
Alkaline Phosphatase: 66 U/L (ref 39–117)
BUN: 25 mg/dL — ABNORMAL HIGH (ref 6–23)
CO2: 28 mEq/L (ref 19–32)
Calcium: 9.5 mg/dL (ref 8.4–10.5)
Chloride: 106 mEq/L (ref 96–112)
Creatinine, Ser: 0.99 mg/dL (ref 0.40–1.50)
GFR: 81.7 mL/min (ref >60.00)
Glucose, Bld: 97 mg/dL (ref 70–99)
Potassium: 4.3 mEq/L (ref 3.5–5.1)
Sodium: 139 mEq/L (ref 135–145)
Total Bilirubin: 0.5 mg/dL (ref 0.2–1.2)
Total Protein: 6.8 g/dL (ref 6.0–8.3)

## 2015-02-25 LAB — TSH: TSH: 1.29 u[IU]/mL (ref 0.35–4.50)

## 2015-02-25 LAB — CBC WITH DIFFERENTIAL/PLATELET
Basophils Absolute: 0 10*3/uL (ref 0.0–0.1)
Basophils Relative: 0.4 % (ref 0.0–3.0)
Eosinophils Absolute: 0.2 10*3/uL (ref 0.0–0.7)
Eosinophils Relative: 3.3 % (ref 0.0–5.0)
HCT: 42.8 % (ref 39.0–52.0)
Hemoglobin: 14.1 g/dL (ref 13.0–17.0)
Lymphocytes Relative: 16.2 % (ref 12.0–46.0)
Lymphs Abs: 1.1 10*3/uL (ref 0.7–4.0)
MCHC: 32.9 g/dL (ref 30.0–36.0)
MCV: 85.4 fl (ref 78.0–100.0)
Monocytes Absolute: 0.6 10*3/uL (ref 0.1–1.0)
Monocytes Relative: 8.1 % (ref 3.0–12.0)
Neutro Abs: 5.1 10*3/uL (ref 1.4–7.7)
Neutrophils Relative %: 72 % (ref 43.0–77.0)
Platelets: 275 10*3/uL (ref 150.0–400.0)
RBC: 5.01 Mil/uL (ref 4.22–5.81)
RDW: 14.8 % (ref 11.5–15.5)
WBC: 7 10*3/uL (ref 4.0–10.5)

## 2015-02-25 LAB — MICROALBUMIN / CREATININE URINE RATIO
Creatinine,U: 125.1 mg/dL
Microalb Creat Ratio: 0.6 mg/g (ref 0.0–30.0)
Microalb, Ur: <0.7 mg/dL (ref 0.0–1.9)

## 2015-02-25 LAB — VITAMIN D 25 HYDROXY (VIT D DEFICIENCY, FRACTURES): VITD: 16.06 ng/mL — ABNORMAL LOW (ref 30.00–100.00)

## 2015-02-25 LAB — LIPID PANEL
Cholesterol: 174 mg/dL (ref 0–200)
HDL: 42.4 mg/dL (ref >39.00)
LDL Cholesterol: 114 mg/dL — ABNORMAL HIGH (ref 0–99)
NonHDL: 131.6
Total CHOL/HDL Ratio: 4
Triglycerides: 89 mg/dL (ref 0.0–149.0)
VLDL: 17.8 mg/dL (ref 0.0–40.0)

## 2015-02-25 LAB — HEMOGLOBIN A1C: Hgb A1c MFr Bld: 5.5 % (ref 4.6–6.5)

## 2015-02-25 MED ORDER — VALACYCLOVIR HCL 500 MG PO TABS: 500.0000 mg | ORAL_TABLET | Freq: Two times a day (BID) | ORAL | Status: DC | PRN

## 2015-02-25 MED ORDER — AMLODIPINE BESYLATE 5 MG PO TABS: ORAL_TABLET | ORAL | Status: DC

## 2015-02-25 MED ORDER — LOSARTAN POTASSIUM-HCTZ 100-25 MG PO TABS: ORAL_TABLET | ORAL | Status: DC

## 2015-02-25 NOTE — Progress Notes (Signed)
Subjective:    Patient ID: Scott Crawford, male    DOB: 10-04-1955, 59 y.o.   MRN: [id number]  HPI  59YO male presents for physical exam.  Difficult time for him. His wife died 06-Jan-2023 at [facility] and his father died last month. Feels that he is coping well. Spending more time with his daughter.  HTN - Feeling lightheaded at times. Compliant with BP meds. Questions if weight loss may have led to lower BP. Would like to get off medication.  Trying to follow a healthy, low carb diet.  Wt Readings from Last 3 Encounters:  02/25/15 237 lb (107.502 kg)  06/18/14 254 lb 8 oz (115.44 kg)  12/12/13 261 lb (118.389 kg)    Past medical, surgical, family and social history per today's encounter.  Review of Systems  Constitutional: Negative for fever, chills, activity change, appetite change, fatigue and unexpected weight change.  Eyes: Negative for visual disturbance.  Respiratory: Negative for cough and shortness of breath.   Cardiovascular: Negative for chest pain, palpitations and leg swelling.  Gastrointestinal: Negative for nausea, vomiting, abdominal pain, diarrhea, constipation and abdominal distention.  Genitourinary: Negative for dysuria, urgency and difficulty urinating.  Musculoskeletal: Negative for arthralgias and gait problem.  Skin: Negative for color change and rash.  Neurological: Positive for light-headedness. Negative for syncope and weakness.  Hematological: Negative for adenopathy.  Psychiatric/Behavioral: Negative for suicidal ideas, sleep disturbance and dysphoric mood. The patient is not nervous/anxious.        Objective:    BP 108/69 mmHg  Pulse 71  Temp(Src) 98.2 F (36.8 C) (Oral)  Ht 5' 7.25" (1.708 m)  Wt 237 lb (107.502 kg)  BMI 36.85 kg/m2  SpO2 96% Physical Exam  Constitutional: He is oriented to person, place, and time. He appears well-developed and well-nourished. No distress.  HENT:  Head: Normocephalic and atraumatic.  Right Ear:  External ear normal.  Left Ear: External ear normal.  Nose: Nose normal.  Mouth/Throat: Oropharynx is clear and moist. No oropharyngeal exudate.  Eyes: Conjunctivae and EOM are normal. Pupils are equal, round, and reactive to light. Right eye exhibits no discharge. Left eye exhibits no discharge. No scleral icterus.  Neck: Normal range of motion. Neck supple. No tracheal deviation present. No thyromegaly present.  Cardiovascular: Normal rate, regular rhythm and normal heart sounds.  Exam reveals no gallop and no friction rub.   No murmur heard. Pulmonary/Chest: Effort normal and breath sounds normal. No accessory muscle usage. No tachypnea. No respiratory distress. He has no decreased breath sounds. He has no wheezes. He has no rhonchi. He has no rales. He exhibits no tenderness.  Abdominal: Soft. Bowel sounds are normal. He exhibits no distension and no mass. There is no tenderness. There is no rebound and no guarding.  Musculoskeletal: Normal range of motion. He exhibits no edema.  Lymphadenopathy:    He has no cervical adenopathy.  Neurological: He is alert and oriented to person, place, and time. No cranial nerve deficit. Coordination normal.  Skin: Skin is warm and dry. No rash noted. He is not diaphoretic. No erythema. No pallor.  Psychiatric: He has a normal mood and affect. His speech is normal and behavior is normal. Judgment and thought content normal. Cognition and memory are normal. He expresses no suicidal ideation.          Assessment & Plan:   Problem List Items Addressed This Visit      Unprioritized   Essential hypertension, benign    BP  Readings from Last 3 Encounters:  02/25/15 108/69  06/18/14 116/74  12/12/13 102/72   BP low recently with symptomatic lightheadedness. Will stop Amlodipine and monitor BP. Follow up by email in 2-3 weeks and in 3 months.      Relevant Medications   losartan-hydrochlorothiazide (HYZAAR) 100-25 MG per tablet   Routine general  medical examination at a health care facility - Primary    General medical exam normal today. Referral for colonoscopy placed, given his brother's recent diagnosis of colon cancer. Labs as ordered. Encouraged healthy diet and exercise. Immunizations are UTD.      Relevant Orders   CBC with Differential/Platelet   Comprehensive metabolic panel   Lipid panel   Microalbumin / creatinine urine ratio   Vit D  25 hydroxy (rtn osteoporosis monitoring)   TSH   Hemoglobin A1c   Severe obesity (BMI >= 40)    Congratulated pt on weight loss. Encouraged continued healthy diet and exercise.       Other Visit Diagnoses    Family history of colon cancer        Relevant Orders    Ambulatory referral to Gastroenterology        Return in about 3 months (around 05/28/2015) for Recheck of Blood Pressure.

## 2015-02-25 NOTE — Assessment & Plan Note (Signed)
Congratulated pt on weight loss. Encouraged continued healthy diet and exercise.

## 2015-02-25 NOTE — Progress Notes (Signed)
Pre visit review using our clinic review tool, if applicable. No additional management support is needed unless otherwise documented below in the visit note. 

## 2015-02-25 NOTE — Assessment & Plan Note (Signed)
General medical exam normal today. Referral for colonoscopy placed, given his brother's recent diagnosis of colon cancer. Labs as ordered. Encouraged healthy diet and exercise. Immunizations are UTD.

## 2015-02-25 NOTE — Assessment & Plan Note (Signed)
BP Readings from Last 3 Encounters:  02/25/15 108/69  06/18/14 116/74  12/12/13 102/72   BP low recently with symptomatic lightheadedness. Will stop Amlodipine and monitor BP. Follow up by email in 2-3 weeks and in 3 months.

## 2015-02-25 NOTE — Patient Instructions (Signed)
Stop Amlodipine.  Monitor blood pressure 2-3 times per week and email with readings.  Consider reading the book, "Always Hungry" by Isabella Stalling.  Health Maintenance A healthy lifestyle and preventative care can promote health and wellness.  Maintain regular health, dental, and eye exams.  Eat a healthy diet. Foods like vegetables, fruits, whole grains, low-fat dairy products, and lean protein foods contain the nutrients you need and are low in calories. Decrease your intake of foods high in solid fats, added sugars, and salt. Get information about a proper diet from your health care provider, if necessary.  Regular physical exercise is one of the most important things you can do for your health. Most adults should get at least 150 minutes of moderate-intensity exercise (any activity that increases your heart rate and causes you to sweat) each week. In addition, most adults need muscle-strengthening exercises on 2 or more days a week.   Maintain a healthy weight. The body mass index (BMI) is a screening tool to identify possible weight problems. It provides an estimate of body fat based on height and weight. Your health care provider can find your BMI and can help you achieve or maintain a healthy weight. For males 20 years and older:  A BMI below 18.5 is considered underweight.  A BMI of 18.5 to 24.9 is normal.  A BMI of 25 to 29.9 is considered overweight.  A BMI of 30 and above is considered obese.  Maintain normal blood lipids and cholesterol by exercising and minimizing your intake of saturated fat. Eat a balanced diet with plenty of fruits and vegetables. Blood tests for lipids and cholesterol should begin at age 51 and be repeated every 5 years. If your lipid or cholesterol levels are high, you are over age 14, or you are at high risk for heart disease, you may need your cholesterol levels checked more frequently.Ongoing high lipid and cholesterol levels should be treated with medicines  if diet and exercise are not working.  If you smoke, find out from your health care provider how to quit. If you do not use tobacco, do not start.  Lung cancer screening is recommended for adults aged 37-80 years who are at high risk for developing lung cancer because of a history of smoking. A yearly low-dose CT scan of the lungs is recommended for people who have at least a 30-pack-year history of smoking and are current smokers or have quit within the past 15 years. A pack year of smoking is smoking an average of 1 pack of cigarettes a day for 1 year (for example, a 30-pack-year history of smoking could mean smoking 1 pack a day for 30 years or 2 packs a day for 15 years). Yearly screening should continue until the smoker has stopped smoking for at least 15 years. Yearly screening should be stopped for people who develop a health problem that would prevent them from having lung cancer treatment.  If you choose to drink alcohol, do not have more than 2 drinks per day. One drink is considered to be 12 oz (360 mL) of beer, 5 oz (150 mL) of wine, or 1.5 oz (45 mL) of liquor.  Avoid the use of street drugs. Do not share needles with anyone. Ask for help if you need support or instructions about stopping the use of drugs.  High blood pressure causes heart disease and increases the risk of stroke. Blood pressure should be checked at least every 1-2 years. Ongoing high blood pressure should be  treated with medicines if weight loss and exercise are not effective.  If you are 36-37 years old, ask your health care provider if you should take aspirin to prevent heart disease.  Diabetes screening involves taking a blood sample to check your fasting blood sugar level. This should be done once every 3 years after age 57 if you are at a normal weight and without risk factors for diabetes. Testing should be considered at a younger age or be carried out more frequently if you are overweight and have at least 1 risk  factor for diabetes.  Colorectal cancer can be detected and often prevented. Most routine colorectal cancer screening begins at the age of 33 and continues through age 62. However, your health care provider may recommend screening at an earlier age if you have risk factors for colon cancer. On a yearly basis, your health care provider may provide home test kits to check for hidden blood in the stool. A small camera at the end of a tube may be used to directly examine the colon (sigmoidoscopy or colonoscopy) to detect the earliest forms of colorectal cancer. Talk to your health care provider about this at age 68 when routine screening begins. A direct exam of the colon should be repeated every 5-10 years through age 68, unless early forms of precancerous polyps or small growths are found.  People who are at an increased risk for hepatitis B should be screened for this virus. You are considered at high risk for hepatitis B if:  You were born in a country where hepatitis B occurs often. Talk with your health care provider about which countries are considered high risk.  Your parents were born in a high-risk country and you have not received a shot to protect against hepatitis B (hepatitis B vaccine).  You have HIV or AIDS.  You use needles to inject street drugs.  You live with, or have sex with, someone who has hepatitis B.  You are a man who has sex with other men (MSM).  You get hemodialysis treatment.  You take certain medicines for conditions like cancer, organ transplantation, and autoimmune conditions.  Hepatitis C blood testing is recommended for all people born from [birth year] through 1967 and any individual with known risk factors for hepatitis C.  Healthy men should no longer receive prostate-specific antigen (PSA) blood tests as part of routine cancer screening. Talk to your health care provider about prostate cancer screening.  Testicular cancer screening is not recommended for  adolescents or adult males who have no symptoms. Screening includes self-exam, a health care provider exam, and other screening tests. Consult with your health care provider about any symptoms you have or any concerns you have about testicular cancer.  Practice safe sex. Use condoms and avoid high-risk sexual practices to reduce the spread of sexually transmitted infections (STIs).  You should be screened for STIs, including gonorrhea and chlamydia if:  You are sexually active and are younger than 24 years.  You are older than 24 years, and your health care provider tells you that you are at risk for this type of infection.  Your sexual activity has changed since you were last screened, and you are at an increased risk for chlamydia or gonorrhea. Ask your health care provider if you are at risk.  If you are at risk of being infected with HIV, it is recommended that you take a prescription medicine daily to prevent HIV infection. This is called pre-exposure prophylaxis (PrEP). You  are considered at risk if:  You are a man who has sex with other men (MSM).  You are a heterosexual man who is sexually active with multiple partners.  You take drugs by injection.  You are sexually active with a partner who has HIV.  Talk with your health care provider about whether you are at high risk of being infected with HIV. If you choose to begin PrEP, you should first be tested for HIV. You should then be tested every 3 months for as long as you are taking PrEP.  Use sunscreen. Apply sunscreen liberally and repeatedly throughout the day. You should seek shade when your shadow is shorter than you. Protect yourself by wearing long sleeves, pants, a wide-brimmed hat, and sunglasses year round whenever you are outdoors.  Tell your health care provider of new moles or changes in moles, especially if there is a change in shape or color. Also, tell your health care provider if a mole is larger than the size of a  pencil eraser.  A one-time screening for abdominal aortic aneurysm (AAA) and surgical repair of large AAAs by ultrasound is recommended for men aged 12-75 years who are current or former smokers.  Stay current with your vaccines (immunizations). Document Released: 03/09/2008 Document Revised: 09/16/2013 Document Reviewed: 02/06/2011 [facility] Patient Information 2015 [place], [place]. This information is not intended to replace advice given to you by your health care provider. Make sure you discuss any questions you have with your health care provider.

## 2015-05-19 DIAGNOSIS — N200 Calculus of kidney: Secondary | ICD-10-CM | POA: Insufficient documentation

## 2015-06-02 ENCOUNTER — Ambulatory Visit (INDEPENDENT_AMBULATORY_CARE_PROVIDER_SITE_OTHER): Payer: BLUE CROSS/BLUE SHIELD | Admitting: Internal Medicine

## 2015-06-02 ENCOUNTER — Other Ambulatory Visit: Payer: Self-pay | Admitting: *Deleted

## 2015-06-02 ENCOUNTER — Encounter: Payer: Self-pay | Admitting: Internal Medicine

## 2015-06-02 VITALS — BP 114/69 | HR 76 | Temp 98.1°F | Ht 67.25 in | Wt 216.0 lb

## 2015-06-02 DIAGNOSIS — Z7721 Contact with and (suspected) exposure to potentially hazardous body fluids: Secondary | ICD-10-CM | POA: Diagnosis not present

## 2015-06-02 DIAGNOSIS — S30861A Insect bite (nonvenomous) of abdominal wall, initial encounter: Secondary | ICD-10-CM | POA: Diagnosis not present

## 2015-06-02 DIAGNOSIS — W57XXXA Bitten or stung by nonvenomous insect and other nonvenomous arthropods, initial encounter: Secondary | ICD-10-CM | POA: Insufficient documentation

## 2015-06-02 DIAGNOSIS — I10 Essential (primary) hypertension: Secondary | ICD-10-CM | POA: Diagnosis not present

## 2015-06-02 MED ORDER — AMLODIPINE BESYLATE 5 MG PO TABS: 5.0000 mg | ORAL_TABLET | Freq: Every day | ORAL | Status: DC

## 2015-06-02 MED ORDER — VALACYCLOVIR HCL 500 MG PO TABS: 500.0000 mg | ORAL_TABLET | Freq: Two times a day (BID) | ORAL | Status: DC | PRN

## 2015-06-02 NOTE — Progress Notes (Signed)
Pre visit review using our clinic review tool, if applicable. No additional management support is needed unless otherwise documented below in the visit note. 

## 2015-06-02 NOTE — Patient Instructions (Signed)
Try decreasing Amlodipine to 2.5mg  daily.  Monitor blood pressure and email with readings.

## 2015-06-02 NOTE — Assessment & Plan Note (Signed)
Recent tick bite on abdomen and leg. Discussed symptoms of RMSF. He will call immediately if any fever, chills, headache or rash.

## 2015-06-02 NOTE — Assessment & Plan Note (Signed)
BP Readings from Last 3 Encounters:  06/02/15 114/69  02/25/15 108/69  06/18/14 116/74   BP well controlled. Discussed limiting Amlodipine to 2.5mg  daily and monitoring BP.

## 2015-06-02 NOTE — Assessment & Plan Note (Signed)
Recent exposure to blood. Discussed potential risks of HIV and Hep C. Risk very low based on exposure. Will check HIV and Hep C antibodies with labs next month.

## 2015-06-02 NOTE — Progress Notes (Signed)
Subjective:    Patient ID: Scott Crawford, male    DOB: [birth year], 59 y.o.   MRN: [id number]  HPI  59YO male presents for followup.  Last visit in 02/2015 stopped Amlodipine.   Started back on Amlodipine. BP was elevated 140-160 and he had some headaches, so restarted 1 month ago. Feeling better. No HA, chest pain.  Treated for kidney stone by Dr. Jacqlyn Larsen.  Passed stone. Has follow up scheduled. Scheduled for colonoscopy as well.  Recent tick bite on abdomen and left knee this week. No fever, chills, HA, rash.  Also recently helped a friend who was injured and had bleeding over arm. Friend is a former drug addict. Not known if IV drugs. No gross blood transferred to pt.  BP Readings from Last 3 Encounters:  06/02/15 114/69  02/25/15 108/69  06/18/14 116/74   Wt Readings from Last 3 Encounters:  06/02/15 216 lb (97.977 kg)  02/25/15 237 lb (107.502 kg)  06/18/14 254 lb 8 oz (115.44 kg)     Past Medical History  Diagnosis Date  . H/O gastroesophageal reflux (GERD)   . Obesity   . Hypertension   . Elevated PSA     Followed by Dr. Jacqlyn Larsen  . Lumbar back pain     ruptured L5   Family History  Problem Relation Age of Onset  . Cancer Mother     Thyroid cancer  . Hyperlipidemia Father   . Dementia Father   . Cancer Maternal Grandmother     leukemia  . Cancer Maternal Grandfather     colon, treated with surgical resection  . Cancer Brother     colon   Past Surgical History  Procedure Laterality Date  . Tonsillectomy  1962    child  . Prostate biopsy  2013    Dr. Jacqlyn Larsen, negative/inconclusive  . 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 Marital Status: Married    Spouse Name: N/A  . Number of Children: N/A  . Years of Education: N/A   Social History Main Topics  . Smoking status: Never Smoker   . Smokeless tobacco: Never Used  . Alcohol Use: No  . Drug Use: No  . Sexual Activity: Not Asked   Other Topics  Concern  . None   Social History Narrative   Lives in [place] with wife. One daughter.      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 Worked for [employer] - regular      Exercise - limited    Review of Systems  Constitutional: Negative for fever, chills, activity change, appetite change, fatigue and unexpected weight change.  Eyes: Negative for visual disturbance.  Respiratory: Negative for cough and shortness of breath.   Cardiovascular: Negative for chest pain, palpitations and leg swelling.  Gastrointestinal: Negative for abdominal pain, diarrhea, constipation and abdominal distention.  Genitourinary: Negative for dysuria, urgency and difficulty urinating.  Musculoskeletal: Negative for arthralgias and gait problem.  Skin: Negative for color change and rash.  Hematological: Negative for adenopathy.  Psychiatric/Behavioral: Negative for suicidal ideas, sleep disturbance and dysphoric mood. The patient is not nervous/anxious.        Objective:    BP 114/69 mmHg  Pulse 76  Temp(Src) 98.1 F (36.7 C) (Oral)  Ht 5' 7.25" (1.708 m)  Wt 216 lb (97.977 kg)  BMI 33.59 kg/m2  SpO2 96% Physical  Exam  Constitutional: He is oriented to person, place, and time. He appears well-developed and well-nourished. No distress.  HENT:  Head: Normocephalic and atraumatic.  Right Ear: External ear normal.  Left Ear: External ear normal.  Nose: Nose normal.  Mouth/Throat: Oropharynx is clear and moist. No oropharyngeal exudate.  Eyes: Conjunctivae and EOM are normal. Pupils are equal, round, and reactive to light. Right eye exhibits no discharge. Left eye exhibits no discharge. No scleral icterus.  Neck: Normal range of motion. Neck supple. No tracheal deviation present. No thyromegaly present.  Cardiovascular: Normal rate, regular rhythm and normal heart sounds.  Exam reveals no gallop and no friction rub.   No murmur heard. Pulmonary/Chest: Effort normal and breath  sounds normal. No accessory muscle usage. No tachypnea. No respiratory distress. He has no decreased breath sounds. He has no wheezes. He has no rhonchi. He has no rales. He exhibits no tenderness.  Musculoskeletal: Normal range of motion. He exhibits no edema.  Lymphadenopathy:    He has no cervical adenopathy.  Neurological: He is alert and oriented to person, place, and time. No cranial nerve deficit. Coordination normal.  Skin: Skin is warm and dry. No rash noted. He is not diaphoretic. There is erythema. No pallor.     Psychiatric: He has a normal mood and affect. His behavior is normal. Judgment and thought content normal.          Assessment & Plan:   Problem List Items Addressed This Visit      Unprioritized   Essential hypertension, benign - Primary    BP Readings from Last 3 Encounters:  06/02/15 114/69  02/25/15 108/69  06/18/14 116/74   BP well controlled. Discussed limiting Amlodipine to 2.5mg  daily and monitoring BP.       Relevant Medications   amLODipine (NORVASC) 5 MG tablet   Other Relevant Orders   Comprehensive metabolic panel   Exposure to blood    Recent exposure to blood. Discussed potential risks of HIV and Hep C. Risk very low based on exposure. Will check HIV and Hep C antibodies with labs next month.      Relevant Orders   Hepatitis C antibody   HIV antibody   Tick bite of abdomen    Recent tick bite on abdomen and leg. Discussed symptoms of RMSF. He will call immediately if any fever, chills, headache or rash.          Return in about 6 months (around 11/30/2015) for Recheck of Blood Pressure.

## 2015-06-18 ENCOUNTER — Encounter: Payer: Self-pay | Admitting: *Deleted

## 2015-06-21 ENCOUNTER — Ambulatory Visit
Admission: RE | Admit: 2015-06-21 | Discharge: 2015-06-21 | Disposition: A | Discharge status: Home / Self Care | Payer: BLUE CROSS/BLUE SHIELD | Source: Ambulatory Visit | Attending: Gastroenterology | Admitting: Gastroenterology

## 2015-06-21 ENCOUNTER — Encounter: Payer: Self-pay | Admitting: *Deleted

## 2015-06-21 ENCOUNTER — Encounter
Admission: RE | Disposition: A | Discharge status: Home / Self Care | Payer: Self-pay | Source: Ambulatory Visit | Attending: Gastroenterology

## 2015-06-21 ENCOUNTER — Ambulatory Visit: Payer: BLUE CROSS/BLUE SHIELD | Admitting: Anesthesiology

## 2015-06-21 DIAGNOSIS — I10 Essential (primary) hypertension: Secondary | ICD-10-CM | POA: Diagnosis not present

## 2015-06-21 DIAGNOSIS — Z818 Family history of other mental and behavioral disorders: Secondary | ICD-10-CM | POA: Diagnosis not present

## 2015-06-21 DIAGNOSIS — Z79899 Other long term (current) drug therapy: Secondary | ICD-10-CM | POA: Insufficient documentation

## 2015-06-21 DIAGNOSIS — F419 Anxiety disorder, unspecified: Secondary | ICD-10-CM | POA: Diagnosis not present

## 2015-06-21 DIAGNOSIS — Z888 Allergy status to other drugs, medicaments and biological substances status: Secondary | ICD-10-CM | POA: Diagnosis not present

## 2015-06-21 DIAGNOSIS — R972 Elevated prostate specific antigen [PSA]: Secondary | ICD-10-CM | POA: Insufficient documentation

## 2015-06-21 DIAGNOSIS — K5730 Diverticulosis of large intestine without perforation or abscess without bleeding: Secondary | ICD-10-CM | POA: Diagnosis not present

## 2015-06-21 DIAGNOSIS — Z6832 Body mass index (BMI) 32.0-32.9, adult: Secondary | ICD-10-CM | POA: Diagnosis not present

## 2015-06-21 DIAGNOSIS — Z9889 Other specified postprocedural states: Secondary | ICD-10-CM | POA: Insufficient documentation

## 2015-06-21 DIAGNOSIS — Z808 Family history of malignant neoplasm of other organs or systems: Secondary | ICD-10-CM | POA: Insufficient documentation

## 2015-06-21 DIAGNOSIS — K219 Gastro-esophageal reflux disease without esophagitis: Secondary | ICD-10-CM | POA: Insufficient documentation

## 2015-06-21 DIAGNOSIS — K589 Irritable bowel syndrome without diarrhea: Secondary | ICD-10-CM | POA: Diagnosis not present

## 2015-06-21 DIAGNOSIS — Z87892 Personal history of anaphylaxis: Secondary | ICD-10-CM | POA: Insufficient documentation

## 2015-06-21 DIAGNOSIS — Z1211 Encounter for screening for malignant neoplasm of colon: Secondary | ICD-10-CM | POA: Diagnosis present

## 2015-06-21 DIAGNOSIS — Z91041 Radiographic dye allergy status: Secondary | ICD-10-CM | POA: Diagnosis not present

## 2015-06-21 DIAGNOSIS — E785 Hyperlipidemia, unspecified: Secondary | ICD-10-CM | POA: Diagnosis not present

## 2015-06-21 DIAGNOSIS — B009 Herpesviral infection, unspecified: Secondary | ICD-10-CM | POA: Insufficient documentation

## 2015-06-21 DIAGNOSIS — Z806 Family history of leukemia: Secondary | ICD-10-CM | POA: Diagnosis not present

## 2015-06-21 DIAGNOSIS — M5136 Other intervertebral disc degeneration, lumbar region: Secondary | ICD-10-CM | POA: Insufficient documentation

## 2015-06-21 DIAGNOSIS — F410 Panic disorder [episodic paroxysmal anxiety] without agoraphobia: Secondary | ICD-10-CM | POA: Insufficient documentation

## 2015-06-21 DIAGNOSIS — Z800 Family history of malignant neoplasm of digestive organs: Secondary | ICD-10-CM | POA: Insufficient documentation

## 2015-06-21 DIAGNOSIS — E669 Obesity, unspecified: Secondary | ICD-10-CM | POA: Diagnosis not present

## 2015-06-21 HISTORY — DX: Abscess of liver: K75.0

## 2015-06-21 HISTORY — DX: Panic disorder (episodic paroxysmal anxiety): F41.0

## 2015-06-21 HISTORY — DX: Personal history of other diseases of the digestive system: Z87.19

## 2015-06-21 HISTORY — DX: Cyst of epididymis: N50.3

## 2015-06-21 HISTORY — DX: Anxiety disorder, unspecified: F41.9

## 2015-06-21 HISTORY — DX: Irritable bowel syndrome, unspecified: K58.9

## 2015-06-21 HISTORY — DX: Other intervertebral disc degeneration, lumbar region without mention of lumbar back pain or lower extremity pain: M51.369

## 2015-06-21 HISTORY — DX: Herpesviral infection, unspecified: B00.9

## 2015-06-21 HISTORY — DX: Other intervertebral disc degeneration, lumbar region: M51.36

## 2015-06-21 HISTORY — PX: COLONOSCOPY WITH PROPOFOL: SHX5780

## 2015-06-21 HISTORY — DX: Hyperlipidemia, unspecified: E78.5

## 2015-06-21 LAB — HM COLONOSCOPY

## 2015-06-21 SURGERY — COLONOSCOPY WITH PROPOFOL
Anesthesia: General

## 2015-06-21 MED ORDER — PROPOFOL 10 MG/ML IV BOLUS
INTRAVENOUS | Status: DC | PRN
  Administered 2015-06-21: 100 mg via INTRAVENOUS
  Administered 2015-06-21: 40 mg via INTRAVENOUS

## 2015-06-21 MED ORDER — LIDOCAINE HCL (CARDIAC) 20 MG/ML IV SOLN
INTRAVENOUS | Status: DC | PRN
  Administered 2015-06-21: 30 mg via INTRAVENOUS

## 2015-06-21 MED ORDER — SODIUM CHLORIDE 0.9 % IV SOLN
INTRAVENOUS | Status: DC
  Administered 2015-06-21: 09:00:00 via INTRAVENOUS

## 2015-06-21 MED ORDER — PROPOFOL 500 MG/50ML IV EMUL
INTRAVENOUS | Status: DC | PRN
  Administered 2015-06-21: 125 ug/kg/min via INTRAVENOUS

## 2015-06-21 NOTE — Anesthesia Preprocedure Evaluation (Signed)
Anesthesia Evaluation  Patient identified by MRN, date of birth, ID band Patient awake    Reviewed: Allergy & Precautions, H&P , NPO status , Patient's Chart, lab work & pertinent test results, reviewed documented beta blocker date and time   History of Anesthesia Complications Negative for: history of anesthetic complications  Airway Mallampati: II  TM Distance: >3 FB Neck ROM: full    Dental no notable dental hx. (+) Teeth Intact   Pulmonary neg pulmonary ROS,    Pulmonary exam normal breath sounds clear to auscultation       Cardiovascular Exercise Tolerance: Good hypertension, On Medications (-) angina(-) CAD, (-) Past MI, (-) Cardiac Stents and (-) CABG Normal cardiovascular exam(-) dysrhythmias (-) Valvular Problems/Murmurs Rhythm:regular Rate:Normal     Neuro/Psych PSYCHIATRIC DISORDERS (anxiety and panic disorder) negative neurological ROS     GI/Hepatic Neg liver ROS, GERD  Controlled,  Endo/Other  negative endocrine ROS  Renal/GU Renal disease (kidney stone)  negative genitourinary   Musculoskeletal   Abdominal   Peds  Hematology negative hematology ROS (+)   Anesthesia Other Findings Past Medical History:   H/O gastroesophageal reflux (GERD)                           Obesity                                                      Hypertension                                                 Elevated PSA                                                   Comment:Followed by Dr. Jacqlyn Larsen   Lumbar back pain                                               Comment:ruptured L5   Anxiety                                                      History of hiatal hernia                                     Hyperlipidemia                                               Panic disorder without agoraphobia  Liver abscess                                                Herpes simplex                                                DDD (degenerative disc disease), lumbar                      Epididymal cyst                                              IBS (irritable bowel syndrome)                               Reproductive/Obstetrics negative OB ROS                             Anesthesia Physical Anesthesia Plan  ASA: II  Anesthesia Plan: General   Post-op Pain Management:    Induction:   Airway Management Planned:   Additional Equipment:   Intra-op Plan:   Post-operative Plan:   Informed Consent: I have reviewed the patients History and Physical, chart, labs and discussed the procedure including the risks, benefits and alternatives for the proposed anesthesia with the patient or authorized representative who has indicated his/her understanding and acceptance.   Dental Advisory Given  Plan Discussed with: Anesthesiologist, CRNA and Surgeon  Anesthesia Plan Comments:         Anesthesia Quick Evaluation

## 2015-06-21 NOTE — H&P (Signed)
Primary Care Physician:  [name], MD Primary Gastroenterologist:  Dr. Candace Cruise  Pre-Procedure History & Physical: HPI:  Scott Crawford is a 59 y.o. male is here for an colonoscopy.   Past Medical History  Diagnosis Date  . H/O gastroesophageal reflux (GERD)   . Obesity   . Hypertension   . Elevated PSA     Followed by Dr. Jacqlyn Larsen  . Lumbar back pain     ruptured L5  . Anxiety   . History of hiatal hernia   . Hyperlipidemia   . Panic disorder without agoraphobia   . Liver abscess   . Herpes simplex   . DDD (degenerative disc disease), lumbar   . Epididymal cyst     Past Surgical History  Procedure Laterality Date  . Tonsillectomy  1962    child  . Prostate biopsy  2013    Dr. Jacqlyn Larsen, negative/inconclusive  . Liver cyst removal      fusobacterium, Dr. Clayborn Bigness  . Colonoscopy    . Esophagogastroduodenoscopy      Prior to Admission medications   Medication Sig Start Date End Date Taking? Authorizing Provider  Misc Natural Products (HERBAL ENERGY COMPLEX PO) Take by mouth.   Yes Historical Provider, MD  Turmeric, Curcuma Longa, ([place]) POWD by Does not apply route.   Yes Historical Provider, MD  amLODipine (NORVASC) 5 MG tablet Take 1 tablet (5 mg total) by mouth daily. 06/02/15   [name], MD  cholecalciferol (VITAMIN D) 400 UNITS TABS tablet Take 400 Units by mouth.    Historical Provider, MD  cyclobenzaprine (FLEXERIL) 10 MG tablet Take 1 tablet (10 mg total) by mouth 3 (three) times daily as needed for muscle spasms. 06/09/13   [name], MD  Diclofenac Sodium (SOLARAZE) 3 % GEL Place 1 application onto the skin as needed. 06/09/13   [name], MD  finasteride (PROSCAR) 5 MG tablet  05/21/13   Historical Provider, MD  losartan-hydrochlorothiazide (HYZAAR) 100-25 MG per tablet TAKE 1 TABLET BY MOUTH DAILY 02/25/15   [name], MD  MELATONIN PO Take by mouth.    Historical Provider, MD  valACYclovir (VALTREX) 500 MG tablet  Take 1 tablet (500 mg total) by mouth 2 (two) times daily as needed (As needed for cold sores). 06/02/15   [name], MD    Allergies as of 05/13/2015 - Review Complete 02/25/2015  Allergen Reaction Noted  . Contrast media [iodinated diagnostic agents] Anaphylaxis 06/09/2013  . Ace inhibitors  10/07/2009    Family History  Problem Relation Age of Onset  . Cancer Mother     Thyroid cancer  . Hyperlipidemia Father   . Dementia Father   . Cancer Maternal Grandmother     leukemia  . Cancer Maternal Grandfather     colon, treated with surgical resection  . Cancer Brother     colon    Social History   Social History  . Marital Status: Widowed    Spouse Name: N/A  . Number of Children: N/A  . Years of Education: N/A   Occupational History  . Not on file.   Social History Main Topics  . Smoking status: Never Smoker   . Smokeless tobacco: Never Used  . Alcohol Use: Yes  . Drug Use: No  . Sexual Activity: Not on file   Other Topics Concern  . Not on file   Social History Narrative   Lives in [place] with wife. One daughter.  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 General:   Alert,  pleasant and cooperative in NAD Head:  Normocephalic and atraumatic. Neck:  Supple; no masses or thyromegaly. Lungs:  Clear throughout to auscultation.    Heart:  Regular rate and rhythm. Abdomen:  Soft, nontender and nondistended. Normal bowel sounds, without guarding, and without rebound.   Neurologic:  Alert and  oriented x4;  grossly normal neurologically.  Impression/Plan: Scott Crawford is here for an colonoscopy to be performed for family hx of colon cancer Risks, benefits, limitations, and alternatives regarding family hx of colon cancer. have been reviewed with the  patient.  Questions have been answered.  All parties agreeable.   OH, Lupita Dawn, MD  06/21/2015, 7:53 AM

## 2015-06-21 NOTE — Transfer of Care (Signed)
Immediate Anesthesia Transfer of Care Note  Patient: Scott Crawford  Procedure(s) Performed: Procedure(s): COLONOSCOPY WITH PROPOFOL (N/A)  Patient Location: Endoscopy Unit  Anesthesia Type:General  Level of Consciousness: awake, alert  and oriented  Airway & Oxygen Therapy: Patient Spontanous Breathing and Patient connected to nasal cannula oxygen  Post-op Assessment: Report given to RN and Post -op Vital signs reviewed and stable  Post vital signs: Reviewed and stable  Last Vitals:  Filed Vitals:   06/21/15 0819  BP: 135/90  Pulse: 92  Temp: 36.7 C  Resp: 18    Complications: No apparent anesthesia complications

## 2015-06-21 NOTE — Op Note (Signed)
[facility] Gastroenterology Patient Name: Scott Crawford Procedure Date: 06/21/2015 9:20 AM MRN: [id number] Account #: [id number] Date of Birth: 1955-12-03 Admit Type: Outpatient Age: 59 Room: [facility] ENDO ROOM 4 Gender: Male Note Status: Finalized Procedure:         Colonoscopy Indications:       Screening patient at increased risk: Family history of                     1st-degree relative with colorectal cancer at age 46 years                     (or older), Younger brother diagnosed with collon cancer. Providers:         Lupita Dawn. Candace Cruise, MD Referring MD:      Eduard Clos. Gilford Rile, MD (Referring MD) Medicines:         Monitored Anesthesia Care Complications:     No immediate complications. Procedure:         Pre-Anesthesia Assessment:                    - Prior to the procedure, a History and Physical was                     performed, and patient medications, allergies and                     sensitivities were reviewed. The patient's tolerance of                     previous anesthesia was reviewed.                    - The risks and benefits of the procedure and the sedation                     options and risks were discussed with the patient. All                     questions were answered and informed consent was obtained.                    - After reviewing the risks and benefits, the patient was                     deemed in satisfactory condition to undergo the procedure.                    After obtaining informed consent, the colonoscope was                     passed under direct vision. Throughout the procedure, the                     patient's blood pressure, pulse, and oxygen saturations                     were monitored continuously. The Colonoscope was                     introduced through the anus and advanced to the the cecum,                     identified by appendiceal orifice and ileocecal valve. The  colonoscopy was  performed without difficulty. The patient                     tolerated the procedure well. The quality of the bowel                     preparation was good. Findings:      A few small-mouthed diverticula were found in the sigmoid colon.      The exam was otherwise without abnormality. Impression:        - Diverticulosis in the sigmoid colon.                    - The examination was otherwise normal.                    - No specimens collected. Recommendation:    - Discharge patient to home.                    - Repeat colonoscopy in 5 years for surveillance.                    - The findings and recommendations were discussed with the                     patient. Procedure Code(s): --- Professional ---                    [phone], Colonoscopy, flexible; diagnostic, including                     collection of specimen(s) by brushing or washing, when                     performed (separate procedure) Diagnosis Code(s): --- Professional ---                    Z80.0, Family history of malignant neoplasm of digestive                     organs                    K57.30, Diverticulosis of large intestine without                     perforation or abscess without bleeding CPT copyright 2014 American Medical Association. All rights reserved. The codes documented in this report are preliminary and upon coder review may  be revised to meet current compliance requirements. Hulen Luster, MD 06/21/2015 9:37:56 AM This report has been signed electronically. Number of Addenda: 0 Note Initiated On: 06/21/2015 9:20 AM Scope Withdrawal Time: 0 hours 7 minutes 36 seconds  Total Procedure Duration: 0 hours 9 minutes 34 seconds       [facility]

## 2015-06-22 ENCOUNTER — Encounter: Payer: Self-pay | Admitting: Gastroenterology

## 2015-06-23 NOTE — Anesthesia Postprocedure Evaluation (Signed)
  Anesthesia Post-op Note  Patient: Scott Crawford  Procedure(s) Performed: Procedure(s): COLONOSCOPY WITH PROPOFOL (N/A)  Anesthesia type:General  Patient location: PACU  Post pain: Pain level controlled  Post assessment: Post-op Vital signs reviewed, Patient's Cardiovascular Status Stable, Respiratory Function Stable, Patent Airway and No signs of Nausea or vomiting  Post vital signs: Reviewed and stable  Last Vitals:  Filed Vitals:   06/21/15 1020  BP: 134/78  Pulse: 65  Temp:   Resp: 16    Level of consciousness: awake, alert  and patient cooperative  Complications: No apparent anesthesia complications

## 2015-07-22 ENCOUNTER — Encounter: Payer: Self-pay | Admitting: Internal Medicine

## 2015-07-26 ENCOUNTER — Other Ambulatory Visit (INDEPENDENT_AMBULATORY_CARE_PROVIDER_SITE_OTHER): Payer: BLUE CROSS/BLUE SHIELD

## 2015-07-26 DIAGNOSIS — I10 Essential (primary) hypertension: Secondary | ICD-10-CM

## 2015-07-26 DIAGNOSIS — Z7721 Contact with and (suspected) exposure to potentially hazardous body fluids: Secondary | ICD-10-CM

## 2015-07-26 LAB — COMPREHENSIVE METABOLIC PANEL
ALT: 13 U/L (ref 0–53)
AST: 17 U/L (ref 0–37)
Albumin: 3.8 g/dL (ref 3.5–5.2)
Alkaline Phosphatase: 59 U/L (ref 39–117)
BUN: 17 mg/dL (ref 6–23)
CO2: 28 mEq/L (ref 19–32)
Calcium: 9.8 mg/dL (ref 8.4–10.5)
Chloride: 103 mEq/L (ref 96–112)
Creatinine, Ser: 1 mg/dL (ref 0.40–1.50)
GFR: 80.65 mL/min (ref >60.00)
Glucose, Bld: 103 mg/dL — ABNORMAL HIGH (ref 70–99)
Potassium: 3.8 mEq/L (ref 3.5–5.1)
Sodium: 140 mEq/L (ref 135–145)
Total Bilirubin: 0.5 mg/dL (ref 0.2–1.2)
Total Protein: 6.7 g/dL (ref 6.0–8.3)

## 2015-07-27 LAB — HEPATITIS C ANTIBODY: HCV Ab: NEGATIVE

## 2015-07-27 LAB — HIV ANTIBODY (ROUTINE TESTING W REFLEX): HIV 1&2 Ab, 4th Generation: NONREACTIVE

## 2015-11-30 ENCOUNTER — Encounter: Payer: Self-pay | Admitting: Internal Medicine

## 2015-11-30 ENCOUNTER — Ambulatory Visit (INDEPENDENT_AMBULATORY_CARE_PROVIDER_SITE_OTHER): Payer: BLUE CROSS/BLUE SHIELD | Admitting: Internal Medicine

## 2015-11-30 VITALS — BP 106/66 | HR 81 | Temp 97.9°F | Ht 67.25 in | Wt 211.0 lb

## 2015-11-30 DIAGNOSIS — Z6830 Body mass index (BMI) 30.0-30.9, adult: Secondary | ICD-10-CM

## 2015-11-30 DIAGNOSIS — E559 Vitamin D deficiency, unspecified: Secondary | ICD-10-CM | POA: Insufficient documentation

## 2015-11-30 DIAGNOSIS — I10 Essential (primary) hypertension: Secondary | ICD-10-CM

## 2015-11-30 DIAGNOSIS — Z6839 Body mass index (BMI) 39.0-39.9, adult: Secondary | ICD-10-CM | POA: Insufficient documentation

## 2015-11-30 LAB — COMPREHENSIVE METABOLIC PANEL
ALT: 11 U/L (ref 0–53)
AST: 13 U/L (ref 0–37)
Albumin: 4 g/dL (ref 3.5–5.2)
Alkaline Phosphatase: 48 U/L (ref 39–117)
BUN: 24 mg/dL — ABNORMAL HIGH (ref 6–23)
CO2: 27 mEq/L (ref 19–32)
Calcium: 9.6 mg/dL (ref 8.4–10.5)
Chloride: 107 mEq/L (ref 96–112)
Creatinine, Ser: 0.96 mg/dL (ref 0.40–1.50)
GFR: 84.44 mL/min (ref >60.00)
Glucose, Bld: 85 mg/dL (ref 70–99)
Potassium: 4 mEq/L (ref 3.5–5.1)
Sodium: 144 mEq/L (ref 135–145)
Total Bilirubin: 0.5 mg/dL (ref 0.2–1.2)
Total Protein: 6.4 g/dL (ref 6.0–8.3)

## 2015-11-30 LAB — VITAMIN D 25 HYDROXY (VIT D DEFICIENCY, FRACTURES): VITD: 31.38 ng/mL (ref 30.00–100.00)

## 2015-11-30 MED ORDER — LOSARTAN POTASSIUM-HCTZ 100-25 MG PO TABS: ORAL_TABLET | ORAL | Status: DC

## 2015-11-30 MED ORDER — VALACYCLOVIR HCL 500 MG PO TABS: 500.0000 mg | ORAL_TABLET | Freq: Two times a day (BID) | ORAL | Status: DC | PRN

## 2015-11-30 MED ORDER — AMLODIPINE BESYLATE 5 MG PO TABS: 5.0000 mg | ORAL_TABLET | Freq: Every day | ORAL | Status: DC

## 2015-11-30 MED ORDER — CYCLOBENZAPRINE HCL 10 MG PO TABS: 10.0000 mg | ORAL_TABLET | Freq: Three times a day (TID) | ORAL | Status: DC | PRN

## 2015-11-30 NOTE — Patient Instructions (Signed)
Labs today.  Follow up in 6 months for physical. 

## 2015-11-30 NOTE — Progress Notes (Signed)
Pre visit review using our clinic review tool, if applicable. No additional management support is needed unless otherwise documented below in the visit note. 

## 2015-11-30 NOTE — Assessment & Plan Note (Signed)
Recheck Vit D with labs today. 

## 2015-11-30 NOTE — Progress Notes (Signed)
Subjective:    Patient ID: Scott Crawford, male    DOB: 1956/03/16, 60 y.o.   MRN: [id number]  HPI  60YO male presents for follow up.  Recently fitted for hearing aide.  HTN - Compliant with medication. Feeling well. No CP, HA, palpitations.  Feels that weight loss has plateaued. Working on adding exercise in effort to lose weight. Has lost over 50lb in total.  Wt Readings from Last 3 Encounters:  11/30/15 211 lb (95.709 kg)  06/21/15 205 lb (92.987 kg)  06/02/15 216 lb (97.977 kg)   BP Readings from Last 3 Encounters:  11/30/15 106/66  06/21/15 134/78  06/02/15 114/69    Past Medical History  Diagnosis Date  . H/O gastroesophageal reflux (GERD)   . Obesity   . Hypertension   . Elevated PSA     Followed by Dr. Jacqlyn Larsen  . Lumbar back pain     ruptured L5  . Anxiety   . History of hiatal hernia   . Hyperlipidemia   . Panic disorder without agoraphobia   . Liver abscess   . Herpes simplex   . DDD (degenerative disc disease), lumbar   . Epididymal cyst   . IBS (irritable bowel syndrome)    Family History  Problem Relation Age of Onset  . Cancer Mother     Thyroid cancer  . Hyperlipidemia Father   . Dementia Father   . Cancer Maternal Grandmother     leukemia  . Cancer Maternal Grandfather     colon, treated with surgical resection  . Cancer Brother     colon   Past Surgical History  Procedure Laterality Date  . Tonsillectomy  1962    child  . Prostate biopsy  2013    Dr. Jacqlyn Larsen, negative/inconclusive  . Liver cyst removal      fusobacterium, Dr. Clayborn Bigness  . Colonoscopy    . Esophagogastroduodenoscopy    . Colonoscopy with propofol N/A 06/21/2015    Procedure: COLONOSCOPY WITH PROPOFOL;  Surgeon: Hulen Luster, MD;  Location: [facility] ENDOSCOPY;  Service: Gastroenterology;  Laterality: N/A;   Social History   Social History  . Marital Status: Widowed    Spouse Name: N/A  . Number of Children: N/A  . Years of Education: N/A   Social History Main Topics    . Smoking status: Never Smoker   . Smokeless tobacco: Never Used  . Alcohol Use: Yes  . Drug Use: No  . Sexual Activity: Not Asked   Other Topics Concern  . None   Social History Narrative   Lives in [place] with wife. One daughter.      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  Eyes: Negative for visual disturbance.  Respiratory: Negative for cough and shortness of breath.   Cardiovascular: Negative for chest pain, palpitations and leg swelling.  Gastrointestinal: Negative for nausea, vomiting, abdominal pain, diarrhea, constipation and abdominal distention.  Genitourinary: Negative for dysuria, urgency and difficulty urinating.  Musculoskeletal: Negative for arthralgias and gait problem.  Skin: Negative for color change and rash.  Hematological: Negative for adenopathy.  Psychiatric/Behavioral: Negative for sleep disturbance and dysphoric mood. The patient is not nervous/anxious.        Objective:    BP 106/66  mmHg  Pulse 81  Temp(Src) 97.9 F (36.6 C) (Oral)  Ht 5' 7.25" (1.708 m)  Wt 211 lb (95.709 kg)  BMI 32.81 kg/m2  SpO2 98% Physical Exam  Constitutional: He is oriented to person, place, and time. He appears well-developed and well-nourished. No distress.  HENT:  Head: Normocephalic and atraumatic.  Right Ear: External ear normal.  Left Ear: External ear normal.  Nose: Nose normal.  Mouth/Throat: Oropharynx is clear and moist. No oropharyngeal exudate.  Eyes: Conjunctivae and EOM are normal. Pupils are equal, round, and reactive to light. Right eye exhibits no discharge. Left eye exhibits no discharge. No scleral icterus.  Neck: Normal range of motion. Neck supple. No tracheal deviation present. No thyromegaly present.  Cardiovascular: Normal  rate, regular rhythm and normal heart sounds.  Exam reveals no gallop and no friction rub.   No murmur heard. Pulmonary/Chest: Effort normal and breath sounds normal. No accessory muscle usage. No tachypnea. No respiratory distress. He has no decreased breath sounds. He has no wheezes. He has no rhonchi. He has no rales. He exhibits no tenderness.  Musculoskeletal: Normal range of motion. He exhibits no edema.  Lymphadenopathy:    He has no cervical adenopathy.  Neurological: He is alert and oriented to person, place, and time. No cranial nerve deficit. Coordination normal.  Skin: Skin is warm and dry. No rash noted. He is not diaphoretic. No erythema. No pallor.  Psychiatric: He has a normal mood and affect. His behavior is normal. Judgment and thought content normal.          Assessment & Plan:   Problem List Items Addressed This Visit      Unprioritized   BMI 30.0-30.9,adult    Wt Readings from Last 3 Encounters:  11/30/15 211 lb (95.709 kg)  06/21/15 205 lb (92.987 kg)  06/02/15 216 lb (97.977 kg)   Weight loss over 50lb total. Encouraged continued healthy diet and exercise.      Essential hypertension, benign - Primary    BP Readings from Last 3 Encounters:  11/30/15 106/66  06/21/15 134/78  06/02/15 114/69   BP well controlled. Continue current medication.      Relevant Medications   amLODipine (NORVASC) 5 MG tablet   losartan-hydrochlorothiazide (HYZAAR) 100-25 MG tablet   Other Relevant Orders   Comprehensive metabolic panel   Vitamin D deficiency    Recheck Vit D with labs today.      Relevant Orders   VITAMIN D 25 Hydroxy (Vit-D Deficiency, Fractures)       Return in about 6 months (around 06/01/2016) for Physical.  Ronette Deter, MD Internal Medicine [facility]

## 2015-11-30 NOTE — Assessment & Plan Note (Signed)
BP Readings from Last 3 Encounters:  11/30/15 106/66  06/21/15 134/78  06/02/15 114/69   BP well controlled. Continue current medication.

## 2015-11-30 NOTE — Assessment & Plan Note (Signed)
Wt Readings from Last 3 Encounters:  11/30/15 211 lb (95.709 kg)  06/21/15 205 lb (92.987 kg)  06/02/15 216 lb (97.977 kg)   Weight loss over 50lb total. Encouraged continued healthy diet and exercise.

## 2015-12-31 DIAGNOSIS — N4340 Spermatocele of epididymis, unspecified: Secondary | ICD-10-CM | POA: Diagnosis not present

## 2015-12-31 DIAGNOSIS — N401 Enlarged prostate with lower urinary tract symptoms: Secondary | ICD-10-CM | POA: Diagnosis not present

## 2015-12-31 DIAGNOSIS — N200 Calculus of kidney: Secondary | ICD-10-CM | POA: Diagnosis not present

## 2015-12-31 DIAGNOSIS — R972 Elevated prostate specific antigen [PSA]: Secondary | ICD-10-CM | POA: Diagnosis not present

## 2016-01-02 DIAGNOSIS — N138 Other obstructive and reflux uropathy: Secondary | ICD-10-CM | POA: Insufficient documentation

## 2016-01-25 DIAGNOSIS — H524 Presbyopia: Secondary | ICD-10-CM | POA: Diagnosis not present

## 2016-03-20 ENCOUNTER — Encounter: Payer: Self-pay | Admitting: Internal Medicine

## 2016-04-24 DIAGNOSIS — D485 Neoplasm of uncertain behavior of skin: Secondary | ICD-10-CM | POA: Diagnosis not present

## 2016-04-24 DIAGNOSIS — L821 Other seborrheic keratosis: Secondary | ICD-10-CM | POA: Diagnosis not present

## 2016-04-24 DIAGNOSIS — Z1283 Encounter for screening for malignant neoplasm of skin: Secondary | ICD-10-CM | POA: Diagnosis not present

## 2016-04-24 DIAGNOSIS — D229 Melanocytic nevi, unspecified: Secondary | ICD-10-CM | POA: Diagnosis not present

## 2016-04-24 DIAGNOSIS — L570 Actinic keratosis: Secondary | ICD-10-CM | POA: Diagnosis not present

## 2016-05-23 DIAGNOSIS — N200 Calculus of kidney: Secondary | ICD-10-CM | POA: Diagnosis not present

## 2016-05-26 DIAGNOSIS — N401 Enlarged prostate with lower urinary tract symptoms: Secondary | ICD-10-CM | POA: Diagnosis not present

## 2016-05-26 DIAGNOSIS — R3129 Other microscopic hematuria: Secondary | ICD-10-CM | POA: Diagnosis not present

## 2016-05-26 DIAGNOSIS — N200 Calculus of kidney: Secondary | ICD-10-CM | POA: Diagnosis not present

## 2016-05-26 DIAGNOSIS — R972 Elevated prostate specific antigen [PSA]: Secondary | ICD-10-CM | POA: Diagnosis not present

## 2016-06-06 ENCOUNTER — Encounter: Payer: BLUE CROSS/BLUE SHIELD | Admitting: Internal Medicine

## 2016-06-23 ENCOUNTER — Encounter: Payer: Self-pay | Admitting: Internal Medicine

## 2016-06-23 ENCOUNTER — Ambulatory Visit (INDEPENDENT_AMBULATORY_CARE_PROVIDER_SITE_OTHER): Payer: BLUE CROSS/BLUE SHIELD | Admitting: Internal Medicine

## 2016-06-23 VITALS — BP 100/68 | HR 93 | Temp 97.7°F | Ht 67.0 in | Wt 212.8 lb

## 2016-06-23 DIAGNOSIS — K5790 Diverticulosis of intestine, part unspecified, without perforation or abscess without bleeding: Secondary | ICD-10-CM

## 2016-06-23 DIAGNOSIS — N200 Calculus of kidney: Secondary | ICD-10-CM | POA: Diagnosis not present

## 2016-06-23 DIAGNOSIS — I10 Essential (primary) hypertension: Secondary | ICD-10-CM | POA: Diagnosis not present

## 2016-06-23 DIAGNOSIS — E559 Vitamin D deficiency, unspecified: Secondary | ICD-10-CM

## 2016-06-23 DIAGNOSIS — Z0000 Encounter for general adult medical examination without abnormal findings: Secondary | ICD-10-CM

## 2016-06-23 LAB — CBC WITH DIFFERENTIAL/PLATELET
Basophils Absolute: 0 10*3/uL (ref 0.0–0.1)
Basophils Relative: 0.5 % (ref 0.0–3.0)
Eosinophils Absolute: 0.3 10*3/uL (ref 0.0–0.7)
Eosinophils Relative: 4.5 % (ref 0.0–5.0)
HCT: 44.6 % (ref 39.0–52.0)
Hemoglobin: 15 g/dL (ref 13.0–17.0)
Lymphocytes Relative: 20.8 % (ref 12.0–46.0)
Lymphs Abs: 1.3 10*3/uL (ref 0.7–4.0)
MCHC: 33.7 g/dL (ref 30.0–36.0)
MCV: 87.4 fl (ref 78.0–100.0)
Monocytes Absolute: 0.7 10*3/uL (ref 0.1–1.0)
Monocytes Relative: 10.3 % (ref 3.0–12.0)
Neutro Abs: 4.1 10*3/uL (ref 1.4–7.7)
Neutrophils Relative %: 63.9 % (ref 43.0–77.0)
Platelets: 248 10*3/uL (ref 150.0–400.0)
RBC: 5.1 Mil/uL (ref 4.22–5.81)
RDW: 14.1 % (ref 11.5–15.5)
WBC: 6.4 10*3/uL (ref 4.0–10.5)

## 2016-06-23 LAB — COMPREHENSIVE METABOLIC PANEL
ALT: 11 U/L (ref 0–53)
AST: 14 U/L (ref 0–37)
Albumin: 3.8 g/dL (ref 3.5–5.2)
Alkaline Phosphatase: 47 U/L (ref 39–117)
BUN: 30 mg/dL — ABNORMAL HIGH (ref 6–23)
CO2: 31 mEq/L (ref 19–32)
Calcium: 9 mg/dL (ref 8.4–10.5)
Chloride: 106 mEq/L (ref 96–112)
Creatinine, Ser: 1.08 mg/dL (ref 0.40–1.50)
GFR: 73.57 mL/min (ref >60.00)
Glucose, Bld: 95 mg/dL (ref 70–99)
Potassium: 4.4 mEq/L (ref 3.5–5.1)
Sodium: 141 mEq/L (ref 135–145)
Total Bilirubin: 0.5 mg/dL (ref 0.2–1.2)
Total Protein: 6.7 g/dL (ref 6.0–8.3)

## 2016-06-23 LAB — LIPID PANEL
Cholesterol: 167 mg/dL (ref 0–200)
HDL: 60.1 mg/dL (ref >39.00)
LDL Cholesterol: 92 mg/dL (ref 0–99)
NonHDL: 106.88
Total CHOL/HDL Ratio: 3
Triglycerides: 72 mg/dL (ref 0.0–149.0)
VLDL: 14.4 mg/dL (ref 0.0–40.0)

## 2016-06-23 LAB — TSH: TSH: 1.47 u[IU]/mL (ref 0.35–4.50)

## 2016-06-23 MED ORDER — LOSARTAN POTASSIUM-HCTZ 100-25 MG PO TABS: ORAL_TABLET | ORAL | 3 refills | Status: DC

## 2016-06-23 MED ORDER — AMLODIPINE BESYLATE 5 MG PO TABS: 5.0000 mg | ORAL_TABLET | Freq: Every day | ORAL | 3 refills | Status: DC

## 2016-06-23 MED ORDER — VALACYCLOVIR HCL 500 MG PO TABS: 500.0000 mg | ORAL_TABLET | Freq: Two times a day (BID) | ORAL | 1 refills | Status: DC | PRN

## 2016-06-23 NOTE — Progress Notes (Signed)
Pre visit review using our clinic review tool, if applicable. No additional management support is needed unless otherwise documented below in the visit note. 

## 2016-06-23 NOTE — Progress Notes (Signed)
Patient ID: Scott Crawford, male   DOB: 1955/12/26, 60 y.o.   MRN: [id number]   Subjective:    Patient ID: Scott Crawford, male    DOB: December 11, 1955, 60 y.o.   MRN: [id number]  HPI  Patient here to establish care.  Also scheduled for a physical.  He saw Dr Jacqlyn Larsen 05/26/16.  He follows his prostate and psa levels.  He is doing well.  Planning to get engaged soon.  Recently lost his wife.  Went to grief counseling.  Is doing ok.  Stays active.  No chest pain.  No sob.  No acid reflux.  No abdominal pain or cramping.  Bowels stable.  Has a history of kidney stones.  Taking allopurinol.  Seeing Dr Jacqlyn Larsen.  Has a h/o a bulging disc.  Back pain stable.  Takes valtrex prn for fever blistera.  Blood pressure on averaging running 130/80.     Past Medical History:  Diagnosis Date  . Anxiety   . DDD (degenerative disc disease), lumbar   . Elevated PSA    Followed by Dr. Jacqlyn Larsen  . Epididymal cyst   . H/O gastroesophageal reflux (GERD)   . Herpes simplex   . History of hiatal hernia   . Hyperlipidemia   . Hypertension   . IBS (irritable bowel syndrome)   . Liver abscess   . Lumbar back pain    ruptured L5  . Obesity   . Panic disorder without agoraphobia    Past Surgical History:  Procedure Laterality Date  . COLONOSCOPY    . COLONOSCOPY WITH PROPOFOL N/A 06/21/2015   Procedure: COLONOSCOPY WITH PROPOFOL;  Surgeon: Hulen Luster, MD;  Location: [facility] ENDOSCOPY;  Service: Gastroenterology;  Laterality: N/A;  . ESOPHAGOGASTRODUODENOSCOPY    . 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 PROSTATE BIOPSY  2013   Dr. Jacqlyn Larsen, negative/inconclusive  . TONSILLECTOMY  1962   child   Family History  Problem Relation Age of Onset  . Cancer Mother     Thyroid cancer  . Hyperlipidemia Father   . Dementia Father   . Cancer Maternal Grandmother     leukemia  . Cancer Maternal Grandfather     colon, treated with surgical resection  . Cancer Brother     colon   Social History   Social  History  . Marital status: Widowed    Spouse name: N/A  . Number of children: N/A  . Years of education: N/A   Social History Main Topics  . Smoking status: Never Smoker  . Smokeless tobacco: Never Used  . Alcohol use Yes  . Drug use: No  . Sexual activity: Not Asked   Other Topics Concern  . None   Social History Narrative   Lives in [place] with wife. One daughter.      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 acetaminophen (TYLENOL) 325 MG tablet Take by mouth.  Marland Kitchen allopurinol (ZYLOPRIM) 100 MG tablet Take 100 mg by mouth daily.  Marland Kitchen amLODipine (NORVASC) 5 MG tablet Take 1 tablet (5 mg total) by mouth daily.  . cyclobenzaprine (FLEXERIL) 10 MG tablet Take 1 tablet (10 mg total) by mouth 3 (three) times daily as needed for muscle spasms.  . finasteride (PROSCAR) 5 MG tablet   .  ibuprofen (ADVIL,MOTRIN) 200 MG tablet Take by mouth.  . losartan-hydrochlorothiazide (HYZAAR) 100-25 MG tablet TAKE 1 TABLET BY MOUTH DAILY  . MELATONIN PO Take by mouth.  . Misc Natural Products (HERBAL ENERGY COMPLEX PO) Take by mouth.  . Multiple Vitamin (MULTI-VITAMINS) TABS Take by mouth.  . sildenafil (REVATIO) 20 MG tablet 3-5 tablets po daily as needed  . valACYclovir (VALTREX) 500 MG tablet Take 1 tablet (500 mg total) by mouth 2 (two) times daily as needed (As needed for cold sores).  . [DISCONTINUED] amLODipine (NORVASC) 5 MG tablet Take 1 tablet (5 mg total) by mouth daily.  . [DISCONTINUED] losartan-hydrochlorothiazide (HYZAAR) 100-25 MG tablet TAKE 1 TABLET BY MOUTH DAILY  . [DISCONTINUED] valACYclovir (VALTREX) 500 MG tablet Take 1 tablet (500 mg total) by mouth 2 (two) times daily as needed (As needed for cold sores).  . [DISCONTINUED] Diclofenac Sodium (SOLARAZE) 3 % GEL Place 1 application onto the skin as needed.   No  facility-administered encounter medications on file as of 06/23/2016.     Review of Systems  Constitutional: Negative for appetite change and unexpected weight change.  HENT: Negative for congestion, sinus pressure and sore throat.   Eyes: Negative for pain and visual disturbance.  Respiratory: Negative for cough, chest tightness and shortness of breath.   Cardiovascular: Negative for chest pain, palpitations and leg swelling.  Gastrointestinal: Negative for abdominal pain, diarrhea, nausea and vomiting.  Genitourinary: Negative for difficulty urinating and frequency.  Musculoskeletal: Negative for joint swelling.       Has a history of bulging disc.   Skin: Negative for color change and rash.  Neurological: Negative for dizziness, light-headedness and headaches.  Hematological: Negative for adenopathy. Does not bruise/bleed easily.  Psychiatric/Behavioral: Negative for agitation and dysphoric mood.       Objective:     Blood pressure rechecked by me:  118/72  Physical Exam  Constitutional: He is oriented to person, place, and time. He appears well-developed and well-nourished. No distress.  HENT:  Head: Normocephalic and atraumatic.  Nose: Nose normal.  Mouth/Throat: Oropharynx is clear and moist. No oropharyngeal exudate.  Eyes: Conjunctivae are normal. Right eye exhibits no discharge. Left eye exhibits no discharge.  Neck: Neck supple. No thyromegaly present.  Cardiovascular: Normal rate and regular rhythm.   Pulmonary/Chest: Breath sounds normal. No respiratory distress. He has no wheezes.  Abdominal: Soft. Bowel sounds are normal. There is no tenderness.  Genitourinary:  Genitourinary Comments: Performed by urology.   Musculoskeletal: He exhibits no edema or tenderness.  Lymphadenopathy:    He has no cervical adenopathy.  Neurological: He is alert and oriented to person, place, and time.  Skin: Skin is warm and dry. No rash noted. No erythema.  Psychiatric: He has a  normal mood and affect. His behavior is normal.    BP 100/68   Pulse 93   Temp 97.7 F (36.5 C) (Oral)   Ht 5\' 7"  (1.702 m)   Wt 212 lb 12.8 oz (96.5 kg)   SpO2 95%   BMI 33.33 kg/m  Wt Readings from Last 3 Encounters:  06/23/16 212 lb 12.8 oz (96.5 kg)  11/30/15 211 lb (95.7 kg)  06/21/15 205 lb (93 kg)     Lab Results  Component Value Date   WBC 6.4 06/23/2016   HGB 15.0 06/23/2016   HCT 44.6 06/23/2016   PLT 248.0 06/23/2016   GLUCOSE 95 06/23/2016   CHOL 167 06/23/2016   TRIG 72.0 06/23/2016   HDL 60.10 06/23/2016  LDLDIRECT 123.7 10/07/2009   LDLCALC 92 06/23/2016   ALT 11 06/23/2016   AST 14 06/23/2016   NA 141 06/23/2016   K 4.4 06/23/2016   CL 106 06/23/2016   CREATININE 1.08 06/23/2016   BUN 30 (H) 06/23/2016   CO2 31 06/23/2016   TSH 1.47 06/23/2016   PSA 2.16 10/07/2009   HGBA1C 5.5 02/25/2015   MICROALBUR <0.7 02/25/2015       Assessment & Plan:   Problem List Items Addressed This Visit    Calculus of kidney    Seeing Dr Jacqlyn Larsen.  Stable.  On allopurinol.        Relevant Medications   allopurinol (ZYLOPRIM) 100 MG tablet   Diverticulosis    Colonoscopy 06/21/15 - diverticulosis.  Recommended f/u in five years.        Essential hypertension, benign    Blood pressure under good control.  Continue same medication regimen.  Follow pressures.  Follow metabolic panel.        Relevant Medications   sildenafil (REVATIO) 20 MG tablet   amLODipine (NORVASC) 5 MG tablet   losartan-hydrochlorothiazide (HYZAAR) 100-25 MG tablet   Other Relevant Orders   CBC with Differential/Platelet (Completed)   Comprehensive metabolic panel (Completed)   TSH (Completed)   Lipid panel (Completed)   Health care maintenance    Physical today 06/23/16.  Sees Dr Jacqlyn Larsen for prostate and psa checks.  Colonoscopy 06/21/15 - diverticulosis.  Recommended f/u in five years.       Vitamin D deficiency    Follow vitamin D level.         Other Visit Diagnoses   None.        Einar Pheasant, MD

## 2016-06-24 ENCOUNTER — Encounter: Payer: Self-pay | Admitting: Internal Medicine

## 2016-06-24 DIAGNOSIS — K5790 Diverticulosis of intestine, part unspecified, without perforation or abscess without bleeding: Secondary | ICD-10-CM | POA: Insufficient documentation

## 2016-06-24 NOTE — Assessment & Plan Note (Signed)
Physical today 06/23/16.  Sees Dr Jacqlyn Larsen for prostate and psa checks.  Colonoscopy 06/21/15 - diverticulosis.  Recommended f/u in five years.

## 2016-06-24 NOTE — Assessment & Plan Note (Signed)
Seeing Dr Jacqlyn Larsen.  Stable.  On allopurinol.

## 2016-06-24 NOTE — Assessment & Plan Note (Signed)
Follow vitamin D level.  

## 2016-06-24 NOTE — Assessment & Plan Note (Signed)
Colonoscopy 06/21/15 - diverticulosis.  Recommended f/u in five years.

## 2016-06-24 NOTE — Assessment & Plan Note (Signed)
Blood pressure under good control.  Continue same medication regimen.  Follow pressures.  Follow metabolic panel.   

## 2016-06-26 ENCOUNTER — Encounter: Payer: Self-pay | Admitting: Internal Medicine

## 2016-11-23 DIAGNOSIS — L578 Other skin changes due to chronic exposure to nonionizing radiation: Secondary | ICD-10-CM | POA: Diagnosis not present

## 2016-11-23 DIAGNOSIS — Z1283 Encounter for screening for malignant neoplasm of skin: Secondary | ICD-10-CM | POA: Diagnosis not present

## 2016-11-23 DIAGNOSIS — L821 Other seborrheic keratosis: Secondary | ICD-10-CM | POA: Diagnosis not present

## 2016-11-23 DIAGNOSIS — D485 Neoplasm of uncertain behavior of skin: Secondary | ICD-10-CM | POA: Diagnosis not present

## 2016-11-23 DIAGNOSIS — L570 Actinic keratosis: Secondary | ICD-10-CM | POA: Diagnosis not present

## 2017-01-29 DIAGNOSIS — H524 Presbyopia: Secondary | ICD-10-CM | POA: Diagnosis not present

## 2017-02-16 ENCOUNTER — Other Ambulatory Visit: Payer: Self-pay

## 2017-02-16 MED ORDER — AMLODIPINE BESYLATE 5 MG PO TABS: 5.0000 mg | ORAL_TABLET | Freq: Every day | ORAL | 1 refills | Status: DC

## 2017-04-30 DIAGNOSIS — L821 Other seborrheic keratosis: Secondary | ICD-10-CM | POA: Diagnosis not present

## 2017-04-30 DIAGNOSIS — L812 Freckles: Secondary | ICD-10-CM | POA: Diagnosis not present

## 2017-04-30 DIAGNOSIS — L570 Actinic keratosis: Secondary | ICD-10-CM | POA: Diagnosis not present

## 2017-04-30 DIAGNOSIS — L820 Inflamed seborrheic keratosis: Secondary | ICD-10-CM | POA: Diagnosis not present

## 2017-04-30 DIAGNOSIS — L578 Other skin changes due to chronic exposure to nonionizing radiation: Secondary | ICD-10-CM | POA: Diagnosis not present

## 2017-06-19 DIAGNOSIS — N138 Other obstructive and reflux uropathy: Secondary | ICD-10-CM | POA: Diagnosis not present

## 2017-06-19 DIAGNOSIS — N200 Calculus of kidney: Secondary | ICD-10-CM | POA: Diagnosis not present

## 2017-06-19 DIAGNOSIS — R3129 Other microscopic hematuria: Secondary | ICD-10-CM | POA: Diagnosis not present

## 2017-06-19 DIAGNOSIS — N401 Enlarged prostate with lower urinary tract symptoms: Secondary | ICD-10-CM | POA: Diagnosis not present

## 2017-06-19 DIAGNOSIS — R972 Elevated prostate specific antigen [PSA]: Secondary | ICD-10-CM | POA: Diagnosis not present

## 2017-06-25 ENCOUNTER — Encounter: Payer: Self-pay | Admitting: Internal Medicine

## 2017-06-25 ENCOUNTER — Ambulatory Visit (INDEPENDENT_AMBULATORY_CARE_PROVIDER_SITE_OTHER): Payer: BLUE CROSS/BLUE SHIELD | Admitting: Internal Medicine

## 2017-06-25 VITALS — BP 108/66 | HR 72 | Temp 98.6°F | Resp 14 | Ht 67.0 in | Wt 242.8 lb

## 2017-06-25 DIAGNOSIS — E559 Vitamin D deficiency, unspecified: Secondary | ICD-10-CM

## 2017-06-25 DIAGNOSIS — R339 Retention of urine, unspecified: Secondary | ICD-10-CM | POA: Insufficient documentation

## 2017-06-25 DIAGNOSIS — Z6838 Body mass index (BMI) 38.0-38.9, adult: Secondary | ICD-10-CM | POA: Diagnosis not present

## 2017-06-25 DIAGNOSIS — Z13220 Encounter for screening for lipoid disorders: Secondary | ICD-10-CM | POA: Diagnosis not present

## 2017-06-25 DIAGNOSIS — Z0000 Encounter for general adult medical examination without abnormal findings: Secondary | ICD-10-CM | POA: Diagnosis not present

## 2017-06-25 DIAGNOSIS — I10 Essential (primary) hypertension: Secondary | ICD-10-CM | POA: Diagnosis not present

## 2017-06-25 LAB — CBC WITH DIFFERENTIAL/PLATELET
Basophils Absolute: 0 10*3/uL (ref 0.0–0.1)
Basophils Relative: 0.4 % (ref 0.0–3.0)
Eosinophils Absolute: 0.3 10*3/uL (ref 0.0–0.7)
Eosinophils Relative: 5.5 % — ABNORMAL HIGH (ref 0.0–5.0)
HCT: 44.4 % (ref 39.0–52.0)
Hemoglobin: 14.6 g/dL (ref 13.0–17.0)
Lymphocytes Relative: 21.2 % (ref 12.0–46.0)
Lymphs Abs: 1.4 10*3/uL (ref 0.7–4.0)
MCHC: 32.9 g/dL (ref 30.0–36.0)
MCV: 88.4 fl (ref 78.0–100.0)
Monocytes Absolute: 0.5 10*3/uL (ref 0.1–1.0)
Monocytes Relative: 7.5 % (ref 3.0–12.0)
Neutro Abs: 4.2 10*3/uL (ref 1.4–7.7)
Neutrophils Relative %: 65.4 % (ref 43.0–77.0)
Platelets: 226 10*3/uL (ref 150.0–400.0)
RBC: 5.02 Mil/uL (ref 4.22–5.81)
RDW: 14 % (ref 11.5–15.5)
WBC: 6.4 10*3/uL (ref 4.0–10.5)

## 2017-06-25 LAB — COMPREHENSIVE METABOLIC PANEL
ALT: 15 U/L (ref 0–53)
AST: 17 U/L (ref 0–37)
Albumin: 3.9 g/dL (ref 3.5–5.2)
Alkaline Phosphatase: 46 U/L (ref 39–117)
BUN: 27 mg/dL — ABNORMAL HIGH (ref 6–23)
CO2: 25 mEq/L (ref 19–32)
Calcium: 9.1 mg/dL (ref 8.4–10.5)
Chloride: 105 mEq/L (ref 96–112)
Creatinine, Ser: 0.93 mg/dL (ref 0.40–1.50)
GFR: 87.15 mL/min (ref >60.00)
Glucose, Bld: 107 mg/dL — ABNORMAL HIGH (ref 70–99)
Potassium: 3.9 mEq/L (ref 3.5–5.1)
Sodium: 139 mEq/L (ref 135–145)
Total Bilirubin: 0.4 mg/dL (ref 0.2–1.2)
Total Protein: 6.4 g/dL (ref 6.0–8.3)

## 2017-06-25 LAB — LIPID PANEL
Cholesterol: 181 mg/dL (ref 0–200)
HDL: 51 mg/dL (ref >39.00)
LDL Cholesterol: 110 mg/dL — ABNORMAL HIGH (ref 0–99)
NonHDL: 129.76
Total CHOL/HDL Ratio: 4
Triglycerides: 101 mg/dL (ref 0.0–149.0)
VLDL: 20.2 mg/dL (ref 0.0–40.0)

## 2017-06-25 LAB — TSH: TSH: 2 u[IU]/mL (ref 0.35–4.50)

## 2017-06-25 LAB — VITAMIN D 25 HYDROXY (VIT D DEFICIENCY, FRACTURES): VITD: 22.81 ng/mL — ABNORMAL LOW (ref 30.00–100.00)

## 2017-06-25 MED ORDER — ZOSTER VAC RECOMB ADJUVANTED 50 MCG/0.5ML IM SUSR: 0.5000 mL | Freq: Once | INTRAMUSCULAR | 0 refills | Status: AC

## 2017-06-25 MED ORDER — LOSARTAN POTASSIUM-HCTZ 100-25 MG PO TABS: ORAL_TABLET | ORAL | 3 refills | Status: DC

## 2017-06-25 MED ORDER — VALACYCLOVIR HCL 500 MG PO TABS: 500.0000 mg | ORAL_TABLET | Freq: Two times a day (BID) | ORAL | 1 refills | Status: DC | PRN

## 2017-06-25 MED ORDER — AMLODIPINE BESYLATE 5 MG PO TABS
5.0000 mg | ORAL_TABLET | Freq: Every day | ORAL | 3 refills | Status: DC
Start: 2017-06-25 — End: 2018-09-16

## 2017-06-25 NOTE — Progress Notes (Signed)
Patient ID: Scott Crawford, male   DOB: 28-Aug-1956, 61 y.o.   MRN: [id number]   Subjective:    Patient ID: Scott Crawford, male    DOB: 01-25-1956, 61 y.o.   MRN: [id number]  HPI  Patient here for his physical exam.  Sates he is doing well.  Recently married (in 02/2017).  Happy.  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  Overall feels he is doing well.  Traveling.  Discussed weight gain.  Discussed diet and exercise.  No chest pain.  No sob.  No acid reflux.  No abdominal pain.  Bowels moving.  No urine change.  Finasteride helping.  Recent psa elevated.  Dr Jacqlyn Larsen following.     Past Medical History:  Diagnosis Date  . Anxiety   . DDD (degenerative disc disease), lumbar   . Elevated PSA    Followed by Dr. Jacqlyn Larsen  . Epididymal cyst   . H/O gastroesophageal reflux (GERD)   . Herpes simplex   . History of hiatal hernia   . Hyperlipidemia   . Hypertension   . IBS (irritable bowel syndrome)   . Liver abscess   . Lumbar back pain    ruptured L5  . Obesity   . Panic disorder without agoraphobia    Past Surgical History:  Procedure Laterality Date  . COLONOSCOPY    . COLONOSCOPY WITH PROPOFOL N/A 06/21/2015   Procedure: COLONOSCOPY WITH PROPOFOL;  Surgeon: Hulen Luster, MD;  Location[facility] ENDOSCOPY;  Service: Gastroenterology;  Laterality: N/A;  . ESOPHAGOGASTRODUODENOSCOPY    . 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 PROSTATE BIOPSY  2013   Dr. Jacqlyn Larsen, negative/inconclusive  . TONSILLECTOMY  1962   child   Family History  Problem Relation Age of Onset  . Cancer Mother        Thyroid cancer  . Hyperlipidemia Father   . Dementia Father   . Cancer Maternal Grandmother        leukemia  . Cancer Maternal Grandfather        colon, treated with surgical resection  . Cancer Brother        colon   Social History   Social History  . Marital status: Widowed    Spouse name: N/A  . Number of children: N/A  . Years of education: N/A   Social  History Main Topics  . Smoking status: Never Smoker  . Smokeless tobacco: Never Used  . Alcohol use Yes  . Drug use: No  . Sexual activity: Not Asked   Other Topics Concern  . None   Social History Narrative   Lives in [place] with wife. One daughter.      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 acetaminophen (TYLENOL) 325 MG tablet Take by mouth.  Marland Kitchen allopurinol (ZYLOPRIM) 100 MG tablet Take 100 mg by mouth daily.  Marland Kitchen amLODipine (NORVASC) 5 MG tablet Take 1 tablet (5 mg total) by mouth daily.  . cyclobenzaprine (FLEXERIL) 10 MG tablet Take 1 tablet (10 mg total) by mouth 3 (three) times daily as needed for muscle spasms.  . finasteride (PROSCAR) 5 MG tablet   . ibuprofen (ADVIL,MOTRIN) 200 MG tablet Take by mouth.  . losartan-hydrochlorothiazide (HYZAAR) 100-25 MG tablet TAKE 1  TABLET BY MOUTH DAILY  . MELATONIN PO Take by mouth.  . Misc Natural Products (HERBAL ENERGY COMPLEX PO) Take by mouth.  . Multiple Vitamin (MULTI-VITAMINS) TABS Take by mouth.  . sildenafil (REVATIO) 20 MG tablet 3-5 tablets po daily as needed  . valACYclovir (VALTREX) 500 MG tablet Take 1 tablet (500 mg total) by mouth 2 (two) times daily as needed (As needed for cold sores).  . [DISCONTINUED] amLODipine (NORVASC) 5 MG tablet Take 1 tablet (5 mg total) by mouth daily.  . [DISCONTINUED] losartan-hydrochlorothiazide (HYZAAR) 100-25 MG tablet TAKE 1 TABLET BY MOUTH DAILY  . [DISCONTINUED] valACYclovir (VALTREX) 500 MG tablet Take 1 tablet (500 mg total) by mouth 2 (two) times daily as needed (As needed for cold sores). (Patient taking differently: Take 2,000 mg by mouth 2 (two) times daily. )  . [EXPIRED] Zoster Vac Recomb Adjuvanted [facility]) injection Inject 0.5 mLs into the muscle once. Second injection in 2-6 months.   No facility-administered encounter  medications on file as of 06/25/2017.     Review of Systems  Constitutional: Negative for appetite change and unexpected weight change.  HENT: Negative for congestion and sinus pressure.   Eyes: Negative for pain and visual disturbance.  Respiratory: Negative for cough, chest tightness and shortness of breath.   Cardiovascular: Negative for chest pain, palpitations and leg swelling.  Gastrointestinal: Negative for abdominal pain, diarrhea, nausea and vomiting.  Genitourinary: Negative for difficulty urinating and dysuria.  Musculoskeletal: Negative for back pain and joint swelling.  Skin: Negative for color change and rash.  Neurological: Negative for dizziness, light-headedness and headaches.  Hematological: Negative for adenopathy. Does not bruise/bleed easily.  Psychiatric/Behavioral: Negative for agitation and dysphoric mood.       Objective:     Blood pressure rechecked:  120/78  Physical Exam  Constitutional: He is oriented to person, place, and time. He appears well-developed and well-nourished. No distress.  HENT:  Head: Normocephalic and atraumatic.  Nose: Nose normal.  Mouth/Throat: Oropharynx is clear and moist. No oropharyngeal exudate.  Eyes: Conjunctivae are normal. Right eye exhibits no discharge. Left eye exhibits no discharge.  Neck: Neck supple. No thyromegaly present.  Cardiovascular: Normal rate and regular rhythm.   Pulmonary/Chest: Breath sounds normal. No respiratory distress. He has no wheezes.  Abdominal: Soft. Bowel sounds are normal. There is no tenderness.  Genitourinary:  Genitourinary Comments: Per urology  Musculoskeletal: He exhibits no edema or tenderness.  Lymphadenopathy:    He has no cervical adenopathy.  Neurological: He is alert and oriented to person, place, and time.  Skin: Skin is warm and dry. No rash noted.  Psychiatric: He has a normal mood and affect. His behavior is normal.    BP 108/66 (BP Location: Left Arm, Patient Position:  Sitting, Cuff Size: Large)   Pulse 72   Temp 98.6 F (37 C) (Oral)   Resp 14   Ht 5\' 7"  (1.702 m)   Wt 242 lb 12.8 oz (110.1 kg)   SpO2 96%   BMI 38.03 kg/m  Wt Readings from Last 3 Encounters:  06/25/17 242 lb 12.8 oz (110.1 kg)  06/23/16 212 lb 12.8 oz (96.5 kg)  11/30/15 211 lb (95.7 kg)     Lab Results  Component Value Date   WBC 6.4 06/25/2017   HGB 14.6 06/25/2017   HCT 44.4 06/25/2017   PLT 226.0 06/25/2017   GLUCOSE 107 (H) 06/25/2017   CHOL 181 06/25/2017   TRIG 101.0 06/25/2017   HDL 51.00 06/25/2017  LDLDIRECT 123.7 10/07/2009   LDLCALC 110 (H) 06/25/2017   ALT 15 06/25/2017   AST 17 06/25/2017   NA 139 06/25/2017   K 3.9 06/25/2017   CL 105 06/25/2017   CREATININE 0.93 06/25/2017   BUN 27 (H) 06/25/2017   CO2 25 06/25/2017   TSH 2.00 06/25/2017   PSA 2.16 10/07/2009   HGBA1C 5.5 02/25/2015   MICROALBUR <0.7 02/25/2015       Assessment & Plan:   Problem List Items Addressed This Visit    BMI 38.0-38.9,adult    Discussed diet and exercise.  Follow.        Essential hypertension, benign    Blood pressure under good control.  Continue same medication regimen.  Follow pressures.  Follow metabolic panel.        Relevant Medications   amLODipine (NORVASC) 5 MG tablet   losartan-hydrochlorothiazide (HYZAAR) 100-25 MG tablet   Other Relevant Orders   CBC with Differential/Platelet (Completed)   Comprehensive metabolic panel (Completed)   TSH (Completed)   Health care maintenance    Physical today 06/25/17.  PSA and prostate followed by Dr Jacqlyn Larsen.  Colonoscopy 05/2015.  Recommended f/u in five years.        Vitamin D deficiency    Recheck vitamin D level.        Relevant Orders   VITAMIN D 25 Hydroxy (Vit-D Deficiency, Fractures) (Completed)    Other Visit Diagnoses    Routine general medical examination at a health care facility    -  Primary   Screening cholesterol level       Relevant Orders   Lipid panel (Completed)       Einar Pheasant, MD

## 2017-06-26 ENCOUNTER — Encounter: Payer: Self-pay | Admitting: Internal Medicine

## 2017-06-26 NOTE — Assessment & Plan Note (Signed)
Discussed diet and exercise.  Follow.  

## 2017-06-26 NOTE — Assessment & Plan Note (Signed)
Blood pressure under good control.  Continue same medication regimen.  Follow pressures.  Follow metabolic panel.   

## 2017-06-26 NOTE — Assessment & Plan Note (Signed)
Recheck vitamin D level 

## 2017-06-26 NOTE — Assessment & Plan Note (Signed)
Physical today 06/25/17.  PSA and prostate followed by Dr Jacqlyn Larsen.  Colonoscopy 05/2015.  Recommended f/u in five years.

## 2017-07-09 DIAGNOSIS — H2511 Age-related nuclear cataract, right eye: Secondary | ICD-10-CM | POA: Diagnosis not present

## 2017-08-15 DIAGNOSIS — H2511 Age-related nuclear cataract, right eye: Secondary | ICD-10-CM | POA: Diagnosis not present

## 2017-08-23 DIAGNOSIS — R972 Elevated prostate specific antigen [PSA]: Secondary | ICD-10-CM | POA: Diagnosis not present

## 2017-08-27 ENCOUNTER — Other Ambulatory Visit: Payer: Self-pay

## 2017-09-05 NOTE — Discharge Instructions (Signed)

## 2017-09-10 ENCOUNTER — Ambulatory Visit
Admission: RE | Admit: 2017-09-10 | Discharge: 2017-09-10 | Disposition: A | Discharge status: Home / Self Care | Payer: BLUE CROSS/BLUE SHIELD | Source: Ambulatory Visit | Attending: Ophthalmology | Admitting: Ophthalmology

## 2017-09-10 ENCOUNTER — Ambulatory Visit: Payer: BLUE CROSS/BLUE SHIELD | Admitting: Anesthesiology

## 2017-09-10 ENCOUNTER — Encounter
Admission: RE | Disposition: A | Discharge status: Home / Self Care | Payer: Self-pay | Source: Ambulatory Visit | Attending: Ophthalmology

## 2017-09-10 DIAGNOSIS — Z91041 Radiographic dye allergy status: Secondary | ICD-10-CM | POA: Insufficient documentation

## 2017-09-10 DIAGNOSIS — H2511 Age-related nuclear cataract, right eye: Secondary | ICD-10-CM | POA: Insufficient documentation

## 2017-09-10 DIAGNOSIS — I10 Essential (primary) hypertension: Secondary | ICD-10-CM | POA: Insufficient documentation

## 2017-09-10 DIAGNOSIS — K219 Gastro-esophageal reflux disease without esophagitis: Secondary | ICD-10-CM | POA: Insufficient documentation

## 2017-09-10 DIAGNOSIS — K589 Irritable bowel syndrome without diarrhea: Secondary | ICD-10-CM | POA: Diagnosis not present

## 2017-09-10 DIAGNOSIS — M1990 Unspecified osteoarthritis, unspecified site: Secondary | ICD-10-CM | POA: Diagnosis not present

## 2017-09-10 DIAGNOSIS — Z6835 Body mass index (BMI) 35.0-35.9, adult: Secondary | ICD-10-CM | POA: Insufficient documentation

## 2017-09-10 DIAGNOSIS — N400 Enlarged prostate without lower urinary tract symptoms: Secondary | ICD-10-CM | POA: Diagnosis not present

## 2017-09-10 DIAGNOSIS — F419 Anxiety disorder, unspecified: Secondary | ICD-10-CM | POA: Insufficient documentation

## 2017-09-10 DIAGNOSIS — Z79899 Other long term (current) drug therapy: Secondary | ICD-10-CM | POA: Insufficient documentation

## 2017-09-10 HISTORY — PX: CATARACT EXTRACTION W/PHACO: SHX586

## 2017-09-10 SURGERY — PHACOEMULSIFICATION, CATARACT, WITH IOL INSERTION
Anesthesia: Monitor Anesthesia Care | Laterality: Right | Wound class: Clean

## 2017-09-10 MED ORDER — BRIMONIDINE TARTRATE-TIMOLOL 0.2-0.5 % OP SOLN
OPHTHALMIC | Status: DC | PRN
Start: 2017-09-10 — End: 2017-09-10
  Administered 2017-09-10: 1 [drp] via OPHTHALMIC

## 2017-09-10 MED ORDER — LACTATED RINGERS IV SOLN: INTRAVENOUS | Status: DC

## 2017-09-10 MED ORDER — BALANCED SALT IO SOLN
INTRAOCULAR | Status: DC | PRN
  Administered 2017-09-10: 1 mL via INTRAMUSCULAR

## 2017-09-10 MED ORDER — NA HYALUR & NA CHOND-NA HYALUR 0.4-0.35 ML IO KIT
PACK | INTRAOCULAR | Status: DC | PRN
Start: 2017-09-10 — End: 2017-09-10
  Administered 2017-09-10: 1 mL via INTRAOCULAR

## 2017-09-10 MED ORDER — LACTATED RINGERS IV SOLN: 500.0000 mL | INTRAVENOUS | Status: DC

## 2017-09-10 MED ORDER — MOXIFLOXACIN HCL 0.5 % OP SOLN
1.0000 [drp] | OPHTHALMIC | Status: DC | PRN
  Administered 2017-09-10 (×3): 1 [drp] via OPHTHALMIC

## 2017-09-10 MED ORDER — BSS IO SOLN
INTRAOCULAR | Status: DC | PRN
  Administered 2017-09-10: 59 mL via OPHTHALMIC

## 2017-09-10 MED ORDER — FENTANYL CITRATE (PF) 100 MCG/2ML IJ SOLN
INTRAMUSCULAR | Status: DC | PRN
  Administered 2017-09-10 (×2): 50 ug via INTRAVENOUS

## 2017-09-10 MED ORDER — ARMC OPHTHALMIC DILATING DROPS
1.0000 "application " | OPHTHALMIC | Status: DC | PRN
  Administered 2017-09-10 (×2): 1 via OPHTHALMIC

## 2017-09-10 MED ORDER — MIDAZOLAM HCL 2 MG/2ML IJ SOLN
INTRAMUSCULAR | Status: DC | PRN
  Administered 2017-09-10: 2 mg via INTRAVENOUS

## 2017-09-10 MED ORDER — MOXIFLOXACIN HCL 0.5 % OP SOLN
OPHTHALMIC | Status: DC | PRN
  Administered 2017-09-10: 0.2 mL via OPHTHALMIC

## 2017-09-10 SURGICAL SUPPLY — 27 items
CANNULA ANT/CHMB 27G (MISCELLANEOUS) ×1 IMPLANT
CANNULA ANT/CHMB 27GA (MISCELLANEOUS) ×2 IMPLANT
CARTRIDGE ABBOTT (MISCELLANEOUS) IMPLANT
GLOVE SURG LX 7.5 STRW (GLOVE) ×1
GLOVE SURG LX STRL 7.5 STRW (GLOVE) ×1 IMPLANT
GLOVE SURG TRIUMPH 8.0 PF LTX (GLOVE) ×2 IMPLANT
GOWN STRL REUS W/ TWL LRG LVL3 (GOWN DISPOSABLE) ×2 IMPLANT
GOWN STRL REUS W/TWL LRG LVL3 (GOWN DISPOSABLE) ×4
LENS IOL TECNIS ITEC 17.0 (Intraocular Lens) ×1 IMPLANT
MARKER SKIN DUAL TIP RULER LAB (MISCELLANEOUS) ×2 IMPLANT
NDL FILTER BLUNT 18X1 1/2 (NEEDLE) ×1 IMPLANT
NDL RETROBULBAR .5 NSTRL (NEEDLE) IMPLANT
NEEDLE FILTER BLUNT 18X 1/2SAF (NEEDLE) ×1
NEEDLE FILTER BLUNT 18X1 1/2 (NEEDLE) ×1 IMPLANT
PACK CATARACT BRASINGTON (MISCELLANEOUS) ×2 IMPLANT
PACK EYE AFTER SURG (MISCELLANEOUS) ×2 IMPLANT
PACK OPTHALMIC (MISCELLANEOUS) ×2 IMPLANT
RING MALYGIN 7.0 (MISCELLANEOUS) IMPLANT
SUT ETHILON 10-0 CS-B-6CS-B-6 (SUTURE)
SUT VICRYL  9 0 (SUTURE)
SUT VICRYL 9 0 (SUTURE) IMPLANT
SUTURE EHLN 10-0 CS-B-6CS-B-6 (SUTURE) IMPLANT
SYR 3ML LL SCALE MARK (SYRINGE) ×2 IMPLANT
SYR 5ML LL (SYRINGE) ×2 IMPLANT
SYR TB 1ML LUER SLIP (SYRINGE) ×2 IMPLANT
WATER STERILE IRR 250ML POUR (IV SOLUTION) ×2 IMPLANT
WIPE NON LINTING 3.25X3.25 (MISCELLANEOUS) ×2 IMPLANT

## 2017-09-10 NOTE — Op Note (Signed)
LOCATION:  [facility]   PREOPERATIVE DIAGNOSIS:    Nuclear sclerotic cataract right eye. H25.11   POSTOPERATIVE DIAGNOSIS:  Nuclear sclerotic cataract right eye.     PROCEDURE:  Phacoemusification with posterior chamber intraocular lens placement of the right eye   LENS:   Implant Name Type Inv. Item Serial No. Manufacturer Lot No. LRB No. Used  LENS IOL DIOP 17.0 - [id number] Intraocular Lens LENS IOL DIOP 17.0 [id number]  Right 1        ULTRASOUND TIME: 16 % of 1 minutes, 0 seconds.  CDE 9.8   SURGEON:  Wyonia Hough, MD   ANESTHESIA:  Topical with tetracaine drops and 2% Xylocaine jelly, augmented with 1% preservative-free intracameral lidocaine.    COMPLICATIONS:  None.   DESCRIPTION OF PROCEDURE:  The patient was identified in the holding room and transported to the operating room and placed in the supine position under the operating microscope.  The right eye was identified as the operative eye and it was prepped and draped in the usual sterile ophthalmic fashion.   A 1 millimeter clear-corneal paracentesis was made at the 12:00 position.  0.5 ml of preservative-free 1% lidocaine was injected into the anterior chamber. The anterior chamber was filled with Viscoat viscoelastic.  A 2.4 millimeter keratome was used to make a near-clear corneal incision at the 9:00 position.  A curvilinear capsulorrhexis was made with a cystotome and capsulorrhexis forceps.  Balanced salt solution was used to hydrodissect and hydrodelineate the nucleus.   Phacoemulsification was then used in stop and chop fashion to remove the lens nucleus and epinucleus.  The remaining cortex was then removed using the irrigation and aspiration handpiece. Provisc was then placed into the capsular bag to distend it for lens placement.  A lens was then injected into the capsular bag.  The remaining viscoelastic was aspirated.   Wounds were hydrated with balanced salt solution.  The anterior  chamber was inflated to a physiologic pressure with balanced salt solution.  No wound leaks were noted. Vigamox 0.2 ml of a 1mg  per ml solution was injected into the anterior chamber for a dose of 0.2 mg of intracameral antibiotic at the completion of the case.   Timolol and Brimonidine drops were applied to the eye.  The patient was taken to the recovery room in stable condition without complications of anesthesia or surgery.   Twain Stenseth 09/10/2017, 11:58 AM

## 2017-09-10 NOTE — H&P (Signed)
The History and Physical notes are on paper, have been signed, and are to be scanned. The patient remains stable and unchanged from the H&P.   Previous H&P reviewed, patient examined, and there are no changes.  Scott Crawford 09/10/2017 11:02 AM

## 2017-09-10 NOTE — Transfer of Care (Signed)
Immediate Anesthesia Transfer of Care Note  Patient: Scott Crawford  Procedure(s) Performed: CATARACT EXTRACTION PHACO AND INTRAOCULAR LENS PLACEMENT (IOC)  RIGHT (Right )  Patient Location: PACU  Anesthesia Type: MAC  Level of Consciousness: awake, alert  and patient cooperative  Airway and Oxygen Therapy: Patient Spontanous Breathing and Patient connected to supplemental oxygen  Post-op Assessment: Post-op Vital signs reviewed, Patient's Cardiovascular Status Stable, Respiratory Function Stable, Patent Airway and No signs of Nausea or vomiting  Post-op Vital Signs: Reviewed and stable  Complications: No apparent anesthesia complications

## 2017-09-10 NOTE — Anesthesia Postprocedure Evaluation (Signed)
Anesthesia Post Note  Patient: Scott Crawford  Procedure(s) Performed: CATARACT EXTRACTION PHACO AND INTRAOCULAR LENS PLACEMENT (IOC)  RIGHT (Right )  Patient location during evaluation: PACU Anesthesia Type: MAC Level of consciousness: awake and alert Pain management: pain level controlled Vital Signs Assessment: post-procedure vital signs reviewed and stable Respiratory status: spontaneous breathing, nonlabored ventilation, respiratory function stable and patient connected to nasal cannula oxygen Cardiovascular status: stable and blood pressure returned to baseline Postop Assessment: no apparent nausea or vomiting Anesthetic complications: no    Safaa Stingley

## 2017-09-10 NOTE — Anesthesia Procedure Notes (Signed)
Procedure Name: MAC Date/Time: 09/10/2017 11:42 AM Performed by: Cameron Ali, CRNA Pre-anesthesia Checklist: Patient identified, Emergency Drugs available, Suction available, Timeout performed and Patient being monitored Patient Re-evaluated:Patient Re-evaluated prior to induction Oxygen Delivery Method: Nasal cannula Placement Confirmation: positive ETCO2

## 2017-09-10 NOTE — Anesthesia Preprocedure Evaluation (Signed)
Anesthesia Evaluation  Patient identified by MRN, date of birth, ID band  Reviewed: NPO status   History of Anesthesia Complications Negative for: history of anesthetic complications  Airway Mallampati: II  TM Distance: >3 FB Neck ROM: full    Dental no notable dental hx.    Pulmonary neg pulmonary ROS,    Pulmonary exam normal        Cardiovascular Exercise Tolerance: Good hypertension, Normal cardiovascular exam     Neuro/Psych Anxiety negative neurological ROS  negative psych ROS   GI/Hepatic Neg liver ROS, GERD  Controlled,ibs   Endo/Other  Morbid obesity (bmi=35)  Renal/GU Renal stones    bph negative genitourinary   Musculoskeletal  (+) Arthritis , ddd   Abdominal   Peds  Hematology negative hematology ROS (+)   Anesthesia Other Findings   Reproductive/Obstetrics                             Anesthesia Physical Anesthesia Plan  ASA: II  Anesthesia Plan: MAC   Post-op Pain Management:    Induction:   PONV Risk Score and Plan:   Airway Management Planned:   Additional Equipment:   Intra-op Plan:   Post-operative Plan:   Informed Consent: I have reviewed the patients History and Physical, chart, labs and discussed the procedure including the risks, benefits and alternatives for the proposed anesthesia with the patient or authorized representative who has indicated his/her understanding and acceptance.     Plan Discussed with: CRNA  Anesthesia Plan Comments:         Anesthesia Quick Evaluation

## 2017-09-11 ENCOUNTER — Encounter: Payer: Self-pay | Admitting: Ophthalmology

## 2017-12-26 ENCOUNTER — Encounter: Payer: Self-pay | Admitting: Internal Medicine

## 2017-12-26 ENCOUNTER — Ambulatory Visit (INDEPENDENT_AMBULATORY_CARE_PROVIDER_SITE_OTHER): Payer: BLUE CROSS/BLUE SHIELD | Admitting: Internal Medicine

## 2017-12-26 ENCOUNTER — Ambulatory Visit (INDEPENDENT_AMBULATORY_CARE_PROVIDER_SITE_OTHER): Payer: BLUE CROSS/BLUE SHIELD

## 2017-12-26 VITALS — BP 122/78 | HR 66 | Temp 98.3°F | Wt 250.4 lb

## 2017-12-26 DIAGNOSIS — E559 Vitamin D deficiency, unspecified: Secondary | ICD-10-CM | POA: Diagnosis not present

## 2017-12-26 DIAGNOSIS — M25561 Pain in right knee: Secondary | ICD-10-CM

## 2017-12-26 DIAGNOSIS — I10 Essential (primary) hypertension: Secondary | ICD-10-CM

## 2017-12-26 DIAGNOSIS — Z6839 Body mass index (BMI) 39.0-39.9, adult: Secondary | ICD-10-CM

## 2017-12-26 LAB — COMPREHENSIVE METABOLIC PANEL
ALT: 16 U/L (ref 0–53)
AST: 15 U/L (ref 0–37)
Albumin: 3.9 g/dL (ref 3.5–5.2)
Alkaline Phosphatase: 63 U/L (ref 39–117)
BUN: 26 mg/dL — ABNORMAL HIGH (ref 6–23)
CO2: 28 mEq/L (ref 19–32)
Calcium: 9.4 mg/dL (ref 8.4–10.5)
Chloride: 104 mEq/L (ref 96–112)
Creatinine, Ser: 1.03 mg/dL (ref 0.40–1.50)
GFR: 77.33 mL/min (ref >60.00)
Glucose, Bld: 103 mg/dL — ABNORMAL HIGH (ref 70–99)
Potassium: 3.9 mEq/L (ref 3.5–5.1)
Sodium: 140 mEq/L (ref 135–145)
Total Bilirubin: 0.6 mg/dL (ref 0.2–1.2)
Total Protein: 6.9 g/dL (ref 6.0–8.3)

## 2017-12-26 LAB — LIPID PANEL
Cholesterol: 162 mg/dL (ref 0–200)
HDL: 39.9 mg/dL (ref >39.00)
LDL Cholesterol: 101 mg/dL — ABNORMAL HIGH (ref 0–99)
NonHDL: 122.38
Total CHOL/HDL Ratio: 4
Triglycerides: 107 mg/dL (ref 0.0–149.0)
VLDL: 21.4 mg/dL (ref 0.0–40.0)

## 2017-12-26 MED ORDER — PANTOPRAZOLE SODIUM 40 MG PO TBEC: 40.0000 mg | DELAYED_RELEASE_TABLET | Freq: Every day | ORAL | 1 refills | Status: DC

## 2017-12-26 NOTE — Progress Notes (Signed)
Patient ID: Scott Crawford, male   DOB: 1956-08-01, 62 y.o.   MRN: [id number]   Subjective:    Patient ID: Scott Crawford, male    DOB: Feb 09, 1956, 62 y.o.   MRN: [id number]  HPI  Patient here for a scheduled follow up.  He reports he is doing relatively well.  Has been having pain in right knee. Localized to medial aspect of knee.  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  No chest pain.  No sob.  Has noticed some increased acid reflux.  Notices more after he takes viagra.  Has been taking otc acid reflux medication.  Request to have prescription PPI.  No abdominal pain.  Bowels moving. Handling stress.  Overall he feels he is doing well.     Past Medical History:  Diagnosis Date  . Anxiety   . DDD (degenerative disc disease), lumbar   . Elevated PSA    Followed by Dr. Jacqlyn Larsen  . Epididymal cyst   . H/O gastroesophageal reflux (GERD)   . Herpes simplex   . History of hiatal hernia   . Hyperlipidemia   . Hypertension   . IBS (irritable bowel syndrome)   . Liver abscess   . Lumbar back pain    ruptured L5  . Obesity   . Panic disorder without agoraphobia    Past Surgical History:  Procedure Laterality Date  . CATARACT EXTRACTION W/PHACO Right 09/10/2017   Procedure: CATARACT EXTRACTION PHACO AND INTRAOCULAR LENS PLACEMENT ([place])  RIGHT;  Surgeon: Leandrew Koyanagi, MD;  Location: [place];  Service: Ophthalmology;  Laterality: Right;  . COLONOSCOPY    . COLONOSCOPY WITH PROPOFOL N/A 06/21/2015   Procedure: COLONOSCOPY WITH PROPOFOL;  Surgeon: Hulen Luster, MD;  Location: [facility] ENDOSCOPY;  Service: Gastroenterology;  Laterality: N/A;  . ESOPHAGOGASTRODUODENOSCOPY    . 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 PROSTATE BIOPSY  2013   Dr. Jacqlyn Larsen, negative/inconclusive  . TONSILLECTOMY  1962   child   Family History  Problem Relation Age of Onset  . Cancer Mother        Thyroid cancer  . Hyperlipidemia Father   . Dementia Father   .  Cancer Maternal Grandmother        leukemia  . Cancer Maternal Grandfather        colon, treated with surgical resection  . Cancer Brother        colon   Social History   Socioeconomic History  . Marital status: Widowed    Spouse name: Not on file  . Number of children: Not on file  . Years of education: Not on file  . Highest education level: Not on file  Occupational History  . Not on file  Social Needs  . Financial resource strain: Not on file  . Food insecurity:    Worry: Not on file    Inability: Not on file  . Transportation needs:    Medical: Not on file    Non-medical: Not on file  Tobacco Use  . Smoking status: Never Smoker  . Smokeless tobacco: Never Used  Substance and Sexual Activity  . Alcohol use: Yes  . Drug use: No  . Sexual activity: Not on file  Lifestyle  . Physical activity:    Days per week: Not on file    Minutes per session: Not on file  . Stress: Not on file  Relationships  . Social connections:    Talks on  phone: Not on file    Gets together: Not on file    Attends religious service: Not on file    Active member of club or organization: Not on file    Attends meetings of clubs or organizations: Not on file    Relationship status: Not on file  Other Topics Concern  . Not on file  Social History Narrative   Lives in [place] with wife. One daughter.      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 acetaminophen (TYLENOL) 325 MG tablet Take by mouth.  Marland Kitchen allopurinol (ZYLOPRIM) 100 MG tablet Take 100 mg by mouth daily.  Marland Kitchen amLODipine (NORVASC) 5 MG tablet Take 1 tablet (5 mg total) by mouth daily.  . cyclobenzaprine (FLEXERIL) 10 MG tablet Take 1 tablet (10 mg total) by mouth 3 (three) times daily as needed for muscle spasms.  . finasteride (PROSCAR) 5 MG tablet   . ibuprofen (ADVIL,MOTRIN) 200  MG tablet Take by mouth.  . losartan-hydrochlorothiazide (HYZAAR) 100-25 MG tablet TAKE 1 TABLET BY MOUTH DAILY  . MELATONIN PO Take by mouth.  . Misc Natural Products (HERBAL ENERGY COMPLEX PO) Take by mouth.  . Multiple Vitamin (MULTI-VITAMINS) TABS Take by mouth.  Marland Kitchen omeprazole (PRILOSEC) 20 MG capsule Take 20 mg by mouth daily.  . pantoprazole (PROTONIX) 40 MG tablet Take 1 tablet (40 mg total) by mouth daily.  . sildenafil (REVATIO) 20 MG tablet 3-5 tablets po daily as needed  . valACYclovir (VALTREX) 500 MG tablet Take 1 tablet (500 mg total) by mouth 2 (two) times daily as needed (As needed for cold sores).   No facility-administered encounter medications on file as of 12/26/2017.     Review of Systems  Constitutional: Negative for appetite change and unexpected weight change.  HENT: Negative for congestion and sinus pressure.   Respiratory: Negative for cough, chest tightness and shortness of breath.   Cardiovascular: Negative for chest pain, palpitations and leg swelling.  Gastrointestinal: Negative for abdominal pain, diarrhea, nausea and vomiting.       Acid reflux as outlined.    Genitourinary: Negative for difficulty urinating and dysuria.  Musculoskeletal: Negative for myalgias.       Right knee pain as outlined.    Skin: Negative for color change and rash.  Neurological: Negative for dizziness, light-headedness and headaches.  Psychiatric/Behavioral: Negative for agitation and dysphoric mood.       Objective:    Physical Exam  Constitutional: He appears well-developed and well-nourished. No distress.  HENT:  Nose: Nose normal.  Mouth/Throat: Oropharynx is clear and moist.  Neck: Neck supple. No thyromegaly present.  Cardiovascular: Normal rate and regular rhythm.  Pulmonary/Chest: Effort normal and breath sounds normal. No respiratory distress.  Abdominal: Soft. Bowel sounds are normal. There is no tenderness.  Musculoskeletal: He exhibits no edema or tenderness.    Lymphadenopathy:    He has no cervical adenopathy.  Skin: No rash noted. No erythema.  Psychiatric: He has a normal mood and affect. His behavior is normal.    BP 122/78 (BP Location: Left Arm, Patient Position: Sitting, Cuff Size: Large)   Pulse 66   Temp 98.3 F (36.8 C) (Oral)   Wt 250 lb 6.4 oz (113.6 kg)   BMI 39.22 kg/m  Wt Readings from Last  3 Encounters:  12/26/17 250 lb 6.4 oz (113.6 kg)  09/10/17 222 lb (100.7 kg)  06/25/17 242 lb 12.8 oz (110.1 kg)     Lab Results  Component Value Date   WBC 6.4 06/25/2017   HGB 14.6 06/25/2017   HCT 44.4 06/25/2017   PLT 226.0 06/25/2017   GLUCOSE 103 (H) 12/26/2017   CHOL 162 12/26/2017   TRIG 107.0 12/26/2017   HDL 39.90 12/26/2017   LDLDIRECT 123.7 10/07/2009   LDLCALC 101 (H) 12/26/2017   ALT 16 12/26/2017   AST 15 12/26/2017   NA 140 12/26/2017   K 3.9 12/26/2017   CL 104 12/26/2017   CREATININE 1.03 12/26/2017   BUN 26 (H) 12/26/2017   CO2 28 12/26/2017   TSH 2.00 06/25/2017   PSA 2.16 10/07/2009   HGBA1C 5.5 02/25/2015   MICROALBUR <0.7 02/25/2015       Assessment & Plan:   Problem List Items Addressed This Visit    BMI 39.0-39.9,adult    Diet and exercise.  Follow.       Essential hypertension, benign - Primary    Blood pressure under good control.  Continue same medication regimen.  Follow pressures.  Follow metabolic panel.        Relevant Orders   Comprehensive metabolic panel (Completed)   Lipid panel (Completed)   Right knee pain    Persistent right knee pain.  Will xray today.  He will make f/u with ortho.        Relevant Orders   DG Knee 1-2 Views Right (Completed)   Vitamin D deficiency    Follow vitamin D level.            Einar Pheasant, MD

## 2017-12-27 ENCOUNTER — Other Ambulatory Visit: Payer: Self-pay | Admitting: Internal Medicine

## 2017-12-27 ENCOUNTER — Encounter: Payer: Self-pay | Admitting: Internal Medicine

## 2017-12-30 ENCOUNTER — Encounter: Payer: Self-pay | Admitting: Internal Medicine

## 2017-12-30 NOTE — Assessment & Plan Note (Signed)
Follow vitamin D level.  

## 2017-12-30 NOTE — Assessment & Plan Note (Signed)
Persistent right knee pain.  Will xray today.  He will make f/u with ortho.

## 2017-12-30 NOTE — Assessment & Plan Note (Signed)
Blood pressure under good control.  Continue same medication regimen.  Follow pressures.  Follow metabolic panel.   

## 2017-12-30 NOTE — Assessment & Plan Note (Signed)
Diet and exercise.  Follow.  

## 2018-01-24 DIAGNOSIS — M25561 Pain in right knee: Secondary | ICD-10-CM | POA: Diagnosis not present

## 2018-01-24 DIAGNOSIS — M2391 Unspecified internal derangement of right knee: Secondary | ICD-10-CM | POA: Diagnosis not present

## 2018-01-29 DIAGNOSIS — R972 Elevated prostate specific antigen [PSA]: Secondary | ICD-10-CM | POA: Diagnosis not present

## 2018-04-03 ENCOUNTER — Ambulatory Visit: Payer: BLUE CROSS/BLUE SHIELD | Admitting: Internal Medicine

## 2018-04-12 DIAGNOSIS — Z961 Presence of intraocular lens: Secondary | ICD-10-CM | POA: Diagnosis not present

## 2018-06-04 DIAGNOSIS — R972 Elevated prostate specific antigen [PSA]: Secondary | ICD-10-CM | POA: Diagnosis not present

## 2018-06-13 DIAGNOSIS — Z125 Encounter for screening for malignant neoplasm of prostate: Secondary | ICD-10-CM | POA: Diagnosis not present

## 2018-06-13 DIAGNOSIS — Z6836 Body mass index (BMI) 36.0-36.9, adult: Secondary | ICD-10-CM | POA: Diagnosis not present

## 2018-06-13 DIAGNOSIS — R339 Retention of urine, unspecified: Secondary | ICD-10-CM | POA: Diagnosis not present

## 2018-06-14 ENCOUNTER — Other Ambulatory Visit: Payer: Self-pay | Admitting: Internal Medicine

## 2018-06-22 ENCOUNTER — Other Ambulatory Visit: Payer: Self-pay | Admitting: Internal Medicine

## 2018-06-22 DIAGNOSIS — I10 Essential (primary) hypertension: Secondary | ICD-10-CM

## 2018-07-01 DIAGNOSIS — L821 Other seborrheic keratosis: Secondary | ICD-10-CM | POA: Diagnosis not present

## 2018-07-01 DIAGNOSIS — Z23 Encounter for immunization: Secondary | ICD-10-CM | POA: Diagnosis not present

## 2018-07-01 DIAGNOSIS — L570 Actinic keratosis: Secondary | ICD-10-CM | POA: Diagnosis not present

## 2018-07-01 DIAGNOSIS — L578 Other skin changes due to chronic exposure to nonionizing radiation: Secondary | ICD-10-CM | POA: Diagnosis not present

## 2018-07-01 DIAGNOSIS — Z1283 Encounter for screening for malignant neoplasm of skin: Secondary | ICD-10-CM | POA: Diagnosis not present

## 2018-07-22 ENCOUNTER — Ambulatory Visit (INDEPENDENT_AMBULATORY_CARE_PROVIDER_SITE_OTHER): Payer: BLUE CROSS/BLUE SHIELD | Admitting: Internal Medicine

## 2018-07-22 ENCOUNTER — Ambulatory Visit: Payer: BLUE CROSS/BLUE SHIELD | Admitting: Internal Medicine

## 2018-07-22 ENCOUNTER — Encounter: Payer: Self-pay | Admitting: Internal Medicine

## 2018-07-22 ENCOUNTER — Encounter

## 2018-07-22 DIAGNOSIS — I10 Essential (primary) hypertension: Secondary | ICD-10-CM | POA: Diagnosis not present

## 2018-07-22 DIAGNOSIS — F439 Reaction to severe stress, unspecified: Secondary | ICD-10-CM | POA: Diagnosis not present

## 2018-07-22 DIAGNOSIS — E559 Vitamin D deficiency, unspecified: Secondary | ICD-10-CM

## 2018-07-22 MED ORDER — BUSPIRONE HCL 5 MG PO TABS: ORAL_TABLET | ORAL | 0 refills | Status: AC

## 2018-07-22 NOTE — Progress Notes (Signed)
Patient ID: Scott Crawford, male   DOB: 1956-01-28, 62 y.o.   MRN: [id number]   Subjective:    Patient ID: Scott Crawford, male    DOB: 01-Jun-1956, 62 y.o.   MRN: [id number]  HPI  Patient here for a scheduled follow up.  Increased stress recently.  Increased stress with his mother's health issues.  Discussed with him.  Feels he needs something to help level things out.  Discussed treatment options.  No chest pain.  No sob.   No acid reflux.  No abdominal pain.  Bowels moving.  Seeing urology - now Dr Alto Denver.  On proscar.  PSA stable.  Last evaluated 05/2018.  Recommended f/u in one year.     Past Medical History:  Diagnosis Date  . Anxiety   . DDD (degenerative disc disease), lumbar   . Elevated PSA    Followed by Dr. Jacqlyn Larsen  . Epididymal cyst   . H/O gastroesophageal reflux (GERD)   . Herpes simplex   . History of hiatal hernia   . Hyperlipidemia   . Hypertension   . IBS (irritable bowel syndrome)   . Liver abscess   . Lumbar back pain    ruptured L5  . Obesity   . Panic disorder without agoraphobia    Past Surgical History:  Procedure Laterality Date  . CATARACT EXTRACTION W/PHACO Right 09/10/2017   Procedure: CATARACT EXTRACTION PHACO AND INTRAOCULAR LENS PLACEMENT ([place])  RIGHT;  Surgeon: Leandrew Koyanagi, MD;  Location: [place];  Service: Ophthalmology;  Laterality: Right;  . COLONOSCOPY    . COLONOSCOPY WITH PROPOFOL N/A 06/21/2015   Procedure: COLONOSCOPY WITH PROPOFOL;  Surgeon: Hulen Luster, MD;  Location: [facility] ENDOSCOPY;  Service: Gastroenterology;  Laterality: N/A;  . ESOPHAGOGASTRODUODENOSCOPY    . 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 PROSTATE BIOPSY  2013   Dr. Jacqlyn Larsen, negative/inconclusive  . TONSILLECTOMY  1962   child   Family History  Problem Relation Age of Onset  . Cancer Mother        Thyroid cancer  . Hyperlipidemia Father   . Dementia Father   . Cancer Maternal Grandmother        leukemia  . Cancer Maternal  Grandfather        colon, treated with surgical resection  . Cancer Brother        colon   Social History   Socioeconomic History  . Marital status: Widowed    Spouse name: Not on file  . Number of children: Not on file  . Years of education: Not on file  . Highest education level: Not on file  Occupational History  . Not on file  Social Needs  . Financial resource strain: Not on file  . Food insecurity:    Worry: Not on file    Inability: Not on file  . Transportation needs:    Medical: Not on file    Non-medical: Not on file  Tobacco Use  . Smoking status: Never Smoker  . Smokeless tobacco: Never Used  Substance and Sexual Activity  . Alcohol use: Yes  . Drug use: No  . Sexual activity: Not on file  Lifestyle  . Physical activity:    Days per week: Not on file    Minutes per session: Not on file  . Stress: Not on file  Relationships  . Social connections:    Talks on phone: Not on file    Gets together: Not on file  Attends religious service: Not on file    Active member of club or organization: Not on file    Attends meetings of clubs or organizations: Not on file    Relationship status: Not on file  Other Topics Concern  . Not on file  Social History Narrative   Lives in [place] with wife. One daughter.      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 acetaminophen (TYLENOL) 325 MG tablet Take by mouth.  Marland Kitchen allopurinol (ZYLOPRIM) 100 MG tablet Take 100 mg by mouth daily.  Marland Kitchen amLODipine (NORVASC) 5 MG tablet Take 1 tablet (5 mg total) by mouth daily.  . busPIRone (BUSPAR) 5 MG tablet One q pm prn  . cyclobenzaprine (FLEXERIL) 10 MG tablet Take 1 tablet (10 mg total) by mouth 3 (three) times daily as needed for muscle spasms.  . finasteride (PROSCAR) 5 MG tablet   . ibuprofen (ADVIL,MOTRIN) 200 MG tablet Take  by mouth.  . losartan-hydrochlorothiazide (HYZAAR) 100-25 MG tablet TAKE 1 TABLET BY MOUTH DAILY  . MELATONIN PO Take by mouth.  . Misc Natural Products (HERBAL ENERGY COMPLEX PO) Take by mouth.  . Multiple Vitamin (MULTI-VITAMINS) TABS Take by mouth.  Marland Kitchen omeprazole (PRILOSEC) 20 MG capsule Take 20 mg by mouth daily.  . pantoprazole (PROTONIX) 40 MG tablet TAKE 1 TABLET(40 MG) BY MOUTH DAILY  . sildenafil (REVATIO) 20 MG tablet 3-5 tablets po daily as needed  . valACYclovir (VALTREX) 500 MG tablet Take 1 tablet (500 mg total) by mouth 2 (two) times daily as needed (As needed for cold sores).   No facility-administered encounter medications on file as of 07/22/2018.     Review of Systems  Constitutional: Negative for appetite change and unexpected weight change.  HENT: Negative for congestion and sinus pressure.   Respiratory: Negative for cough, chest tightness and shortness of breath.   Cardiovascular: Negative for chest pain, palpitations and leg swelling.  Gastrointestinal: Negative for abdominal pain, diarrhea, nausea and vomiting.  Genitourinary: Negative for difficulty urinating and dysuria.  Musculoskeletal: Negative for joint swelling and myalgias.  Skin: Negative for color change and rash.  Neurological: Negative for dizziness, light-headedness and headaches.  Psychiatric/Behavioral: Negative for agitation and dysphoric mood.       Increased stress as outlined.         Objective:    Physical Exam  Constitutional: He appears well-developed and well-nourished. No distress.  HENT:  Nose: Nose normal.  Mouth/Throat: Oropharynx is clear and moist.  Neck: Neck supple. No thyromegaly present.  Cardiovascular: Normal rate and regular rhythm.  Pulmonary/Chest: Effort normal and breath sounds normal. No respiratory distress.  Abdominal: Soft. Bowel sounds are normal. There is no tenderness.  Musculoskeletal: He exhibits no edema or tenderness.  Lymphadenopathy:    He has no  cervical adenopathy.  Skin: No rash noted. No erythema.  Psychiatric: He has a normal mood and affect. His behavior is normal.    BP 106/72 (BP Location: Left Arm, Patient Position: Sitting, Cuff Size: Normal)   Pulse (!) 103   Temp 98.3 F (36.8 C) (Oral)   Resp 18   Wt 253 lb 12.8 oz (115.1 kg)   SpO2 98%   BMI 39.75 kg/m  Wt Readings from Last 3 Encounters:  07/22/18 253 lb 12.8 oz (  115.1 kg)  12/26/17 250 lb 6.4 oz (113.6 kg)  09/10/17 222 lb (100.7 kg)     Lab Results  Component Value Date   WBC 6.4 06/25/2017   HGB 14.6 06/25/2017   HCT 44.4 06/25/2017   PLT 226.0 06/25/2017   GLUCOSE 103 (H) 12/26/2017   CHOL 162 12/26/2017   TRIG 107.0 12/26/2017   HDL 39.90 12/26/2017   LDLDIRECT 123.7 10/07/2009   LDLCALC 101 (H) 12/26/2017   ALT 16 12/26/2017   AST 15 12/26/2017   NA 140 12/26/2017   K 3.9 12/26/2017   CL 104 12/26/2017   CREATININE 1.03 12/26/2017   BUN 26 (H) 12/26/2017   CO2 28 12/26/2017   TSH 2.00 06/25/2017   PSA 2.16 10/07/2009   HGBA1C 5.5 02/25/2015   MICROALBUR <0.7 02/25/2015       Assessment & Plan:   Problem List Items Addressed This Visit    Essential hypertension, benign    Blood pressure under good control.  Continue same medication regimen.  Follow pressures.  Follow metabolic panel.        Relevant Orders   CBC with Differential/Platelet   Hepatic function panel   Lipid panel   TSH   Basic metabolic panel   Stress    Increased stress with some increased anxiety.  Discussed treatment options.  Trial of buspar.  Follow.        Vitamin D deficiency    Continue vitamin D supplements.        Relevant Orders   VITAMIN D 25 Hydroxy (Vit-D Deficiency, Fractures)       Einar Pheasant, MD

## 2018-07-28 ENCOUNTER — Encounter: Payer: Self-pay | Admitting: Internal Medicine

## 2018-07-28 DIAGNOSIS — F439 Reaction to severe stress, unspecified: Secondary | ICD-10-CM | POA: Insufficient documentation

## 2018-07-28 NOTE — Assessment & Plan Note (Signed)
Increased stress with some increased anxiety.  Discussed treatment options.  Trial of buspar.  Follow.

## 2018-07-28 NOTE — Assessment & Plan Note (Signed)
Blood pressure under good control.  Continue same medication regimen.  Follow pressures.  Follow metabolic panel.   

## 2018-07-28 NOTE — Assessment & Plan Note (Signed)
Continue vitamin D supplements.  

## 2018-08-07 ENCOUNTER — Other Ambulatory Visit: Payer: BLUE CROSS/BLUE SHIELD

## 2018-08-12 DIAGNOSIS — H903 Sensorineural hearing loss, bilateral: Secondary | ICD-10-CM | POA: Diagnosis not present

## 2018-09-11 ENCOUNTER — Other Ambulatory Visit: Payer: BLUE CROSS/BLUE SHIELD

## 2018-09-13 ENCOUNTER — Other Ambulatory Visit: Payer: Self-pay | Admitting: Internal Medicine

## 2018-09-13 DIAGNOSIS — I10 Essential (primary) hypertension: Secondary | ICD-10-CM

## 2018-09-16 ENCOUNTER — Other Ambulatory Visit: Payer: Self-pay

## 2018-09-16 MED ORDER — VALACYCLOVIR HCL 500 MG PO TABS: 500.0000 mg | ORAL_TABLET | Freq: Two times a day (BID) | ORAL | 1 refills | Status: DC | PRN

## 2018-09-16 MED ORDER — AMLODIPINE BESYLATE 5 MG PO TABS: 5.0000 mg | ORAL_TABLET | Freq: Every day | ORAL | 3 refills | Status: DC

## 2018-10-23 ENCOUNTER — Encounter: Payer: Self-pay | Admitting: Internal Medicine

## 2018-10-24 NOTE — Telephone Encounter (Signed)
Ok.  Just let him know this is the way we could get it authorized.

## 2018-10-24 NOTE — Telephone Encounter (Signed)
PA for valacyclovir 500 mg bid prn was denied. OK to change directions to q day?

## 2018-10-25 ENCOUNTER — Other Ambulatory Visit: Payer: Self-pay

## 2018-10-25 MED ORDER — VALACYCLOVIR HCL 500 MG PO TABS: 500.0000 mg | ORAL_TABLET | Freq: Every day | ORAL | 1 refills | Status: DC | PRN

## 2018-10-25 NOTE — Telephone Encounter (Signed)
Pt aware and new rx sent in with change in directions

## 2018-11-19 ENCOUNTER — Ambulatory Visit: Payer: BLUE CROSS/BLUE SHIELD | Admitting: Internal Medicine

## 2018-12-16 ENCOUNTER — Telehealth: Payer: Self-pay

## 2018-12-16 NOTE — Telephone Encounter (Signed)
Copied from [place] [id number]. Topic: Appointment Scheduling - Scheduling Inquiry for Clinic >> Dec 16, 2018 12:56 PM [name], [place] wrote: Reason for CRM: Patient states that he has an appointment on 12/23/2018 and would like to know if he could do a telephone visit for this appointment? States that his wife is high risk and he does not wish to come into the office and change bringing something back to her. Please advise.  CB#: [phone]

## 2018-12-17 NOTE — Telephone Encounter (Signed)
Pt is wanting to do a web ex. I will schedule this on outlook. Advised patient that he will receive an email once completed

## 2018-12-20 ENCOUNTER — Telehealth: Payer: Self-pay | Admitting: Internal Medicine

## 2018-12-20 NOTE — Telephone Encounter (Signed)
See other message

## 2018-12-20 NOTE — Telephone Encounter (Signed)
Placed on outlook schedule for Monday. Pt is aware

## 2018-12-20 NOTE — Telephone Encounter (Signed)
I called pt to confirm his appt on Monday and to see if he wants a virtual visit. Pt states someone was suppose to send him a link to his email. Please advise ? Thank you!

## 2018-12-23 ENCOUNTER — Encounter: Payer: Self-pay | Admitting: Internal Medicine

## 2018-12-23 ENCOUNTER — Ambulatory Visit: Payer: BLUE CROSS/BLUE SHIELD | Admitting: Internal Medicine

## 2018-12-23 ENCOUNTER — Ambulatory Visit (INDEPENDENT_AMBULATORY_CARE_PROVIDER_SITE_OTHER): Payer: BLUE CROSS/BLUE SHIELD | Admitting: Internal Medicine

## 2018-12-23 DIAGNOSIS — F439 Reaction to severe stress, unspecified: Secondary | ICD-10-CM | POA: Diagnosis not present

## 2018-12-23 DIAGNOSIS — E7800 Pure hypercholesterolemia, unspecified: Secondary | ICD-10-CM | POA: Diagnosis not present

## 2018-12-23 DIAGNOSIS — I10 Essential (primary) hypertension: Secondary | ICD-10-CM

## 2018-12-23 MED ORDER — VALACYCLOVIR HCL 500 MG PO TABS: 500.0000 mg | ORAL_TABLET | Freq: Every day | ORAL | 3 refills | Status: DC | PRN

## 2018-12-23 MED ORDER — LOSARTAN POTASSIUM-HCTZ 100-25 MG PO TABS: 1.0000 | ORAL_TABLET | Freq: Every day | ORAL | 3 refills | Status: DC

## 2018-12-23 NOTE — Progress Notes (Signed)
Patient ID: Scott Crawford, male   DOB: 25-Sep-1956, 63 y.o.   MRN: [id number] Virtual Visit via Video Note  I connected with Karel Jarvis on 12/23/18 at 10:00 AM EDT by a video enabled telemedicine application and verified that I am speaking with the correct person using two identifiers.  Location patient: home Location provider:work Persons participating in the virtual visit: patient, provider  I discussed the limitations of evaluation and management by telemedicine.  This visit type was conducted due to national recommendations for restrictions regarding the COVID-19 pandemic.  This format is felt to be most appropriate for this patient at this time.   The patient expressed understanding and agreed to proceed.   HPI: Scheduled follow up.  States he is doing well.  Feels good.  Last seen in 06/2018.  Increased stress.  Overall is better.  Never took the buspar.  Feels he is handling things well.  Not exercising regularly.  Discussed diet and exercise.  No chest pain.  No sob.  No acid reflux.  No abdominal pain.  Bowels moving.  Last labs - recommend crestor.  He declines.  Wants to work on diet and exercise.  Saw urology 05/2018.  Stable.  Recommended f/u in one year.     ROS: See pertinent positives and negatives per HPI.   Past Medical History:  Diagnosis Date  . Anxiety   . DDD (degenerative disc disease), lumbar   . Elevated PSA    Followed by Dr. Jacqlyn Larsen  . Epididymal cyst   . H/O gastroesophageal reflux (GERD)   . Herpes simplex   . History of hiatal hernia   . Hyperlipidemia   . Hypertension   . IBS (irritable bowel syndrome)   . Liver abscess   . Lumbar back pain    ruptured L5  . Obesity   . Panic disorder without agoraphobia     Past Surgical History:  Procedure Laterality Date  . CATARACT EXTRACTION W/PHACO Right 09/10/2017   Procedure: CATARACT EXTRACTION PHACO AND INTRAOCULAR LENS PLACEMENT ([place])  RIGHT;  Surgeon: Leandrew Koyanagi, MD;  Location: [facility];  Service: Ophthalmology;  Laterality: Right;  . COLONOSCOPY    . COLONOSCOPY WITH PROPOFOL N/A 06/21/2015   Procedure: COLONOSCOPY WITH PROPOFOL;  Surgeon: Hulen Luster, MD;  Location: [facility] ENDOSCOPY;  Service: Gastroenterology;  Laterality: N/A;  . ESOPHAGOGASTRODUODENOSCOPY    . 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 PROSTATE BIOPSY  2013   Dr. Jacqlyn Larsen, negative/inconclusive  . TONSILLECTOMY  1962   child    Family History  Problem Relation Age of Onset  . Cancer Mother        Thyroid cancer  . Hyperlipidemia Father   . Dementia Father   . Cancer Maternal Grandmother        leukemia  . Cancer Maternal Grandfather        colon, treated with surgical resection  . Cancer Brother        colon    SOCIAL HX: reviewed.    Current Outpatient Medications:  .  acetaminophen (TYLENOL) 325 MG tablet, Take by mouth., Disp: , Rfl:  .  allopurinol (ZYLOPRIM) 100 MG tablet, Take 100 mg by mouth daily., Disp: , Rfl: 11 .  amLODipine (NORVASC) 5 MG tablet, Take 1 tablet (5 mg total) by mouth daily., Disp: 90 tablet, Rfl: 3 .  busPIRone (BUSPAR) 5 MG tablet, One q pm prn, Disp: 90 tablet, Rfl: 0 .  cyclobenzaprine (FLEXERIL) 10  MG tablet, Take 1 tablet (10 mg total) by mouth 3 (three) times daily as needed for muscle spasms., Disp: 90 tablet, Rfl: 3 .  finasteride (PROSCAR) 5 MG tablet, , Disp: , Rfl:  .  ibuprofen (ADVIL,MOTRIN) 200 MG tablet, Take by mouth., Disp: , Rfl:  .  losartan-hydrochlorothiazide (HYZAAR) 100-25 MG tablet, Take 1 tablet by mouth daily., Disp: 90 tablet, Rfl: 3 .  MELATONIN PO, Take by mouth., Disp: , Rfl:  .  Misc Natural Products (HERBAL ENERGY COMPLEX PO), Take by mouth., Disp: , Rfl:  .  Multiple Vitamin (MULTI-VITAMINS) TABS, Take by mouth., Disp: , Rfl:  .  omeprazole (PRILOSEC) 20 MG capsule, Take 20 mg by mouth daily., Disp: , Rfl:  .  pantoprazole (PROTONIX) 40 MG tablet, TAKE 1 TABLET(40 MG) BY MOUTH DAILY, Disp: 90 tablet, Rfl: 0 .   sildenafil (REVATIO) 20 MG tablet, 3-5 tablets po daily as needed, Disp: , Rfl:  .  valACYclovir (VALTREX) 500 MG tablet, Take 1 tablet (500 mg total) by mouth daily as needed (As needed for cold sores)., Disp: 90 tablet, Rfl: 3  EXAM:  GENERAL: alert, oriented, appears well and in no acute distress  HEENT: atraumatic, conjunttiva clear, no obvious abnormalities on inspection of external nose.   NECK: normal movements of the head and neck  LUNGS: on inspection no signs of respiratory distress, breathing rate appears normal, no obvious gross SOB, gasping or wheezing  CV: no obvious cyanosis  PSYCH/NEURO: pleasant and cooperative, no obvious depression or anxiety, speech and thought processing grossly intact  ASSESSMENT AND PLAN:  Discussed the following assessment and plan:  Essential hypertension, benign  Stress  Hypercholesteremia     I discussed the assessment and treatment plan with the patient. The patient was provided an opportunity to ask questions and all were answered. The patient agreed with the plan and demonstrated an understanding of the instructions.   The patient was advised to call back or seek an in-person evaluation if the symptoms worsen or if the condition fails to improve as anticipated.   Einar Pheasant, MD

## 2018-12-23 NOTE — Assessment & Plan Note (Signed)
Have discussed calculated cholesterol risk.  He declines cholesterol medication.  Low cholesterol diet and exercise.  Follow lipid panel.

## 2018-12-23 NOTE — Assessment & Plan Note (Signed)
Blood pressure has been under good control. Continue current medications.  Follow pressures.  Follow metabolic panel.

## 2018-12-23 NOTE — Assessment & Plan Note (Signed)
Stress is better.  Never took the buspar.  Does not feel needs anything more at this time.  Follow.

## 2018-12-28 ENCOUNTER — Encounter: Payer: Self-pay | Admitting: Internal Medicine

## 2018-12-30 ENCOUNTER — Telehealth: Payer: Self-pay

## 2018-12-30 ENCOUNTER — Other Ambulatory Visit: Payer: Self-pay

## 2018-12-30 MED ORDER — PANTOPRAZOLE SODIUM 40 MG PO TBEC: DELAYED_RELEASE_TABLET | ORAL | 2 refills | Status: DC

## 2018-12-30 NOTE — Telephone Encounter (Signed)
Copied from [place] #[id number]. Topic: General - Inquiry >> Dec 30, 2018 12:42 PM Virl Axe D wrote: Reason for CRM: Pt returned VM from [place] regarding an appointment. Pt would like to speak with Lorriane Shire and also stated he is supposed to have new lab orders put in from Dr. Nicki Reaper. Orders are from 07/28/18

## 2018-12-31 NOTE — Telephone Encounter (Signed)
The patient has been scheduled

## 2019-06-27 ENCOUNTER — Telehealth: Payer: Self-pay

## 2019-06-27 NOTE — Telephone Encounter (Signed)
Copied from [place] [id number]. Topic: Appointment Scheduling - Scheduling Inquiry for Clinic >> Jun 27, 2019 11:59 AM Virl Axe D wrote: Reason for CRM: Pt requesting virtual appt instead for appt on 07/02/19. Please advise.

## 2019-07-02 ENCOUNTER — Ambulatory Visit (INDEPENDENT_AMBULATORY_CARE_PROVIDER_SITE_OTHER): Payer: Medicare Other | Admitting: Internal Medicine

## 2019-07-02 ENCOUNTER — Other Ambulatory Visit: Payer: Self-pay

## 2019-07-02 DIAGNOSIS — I10 Essential (primary) hypertension: Secondary | ICD-10-CM | POA: Diagnosis not present

## 2019-07-02 DIAGNOSIS — E7800 Pure hypercholesterolemia, unspecified: Secondary | ICD-10-CM

## 2019-07-02 DIAGNOSIS — F439 Reaction to severe stress, unspecified: Secondary | ICD-10-CM | POA: Diagnosis not present

## 2019-07-02 NOTE — Progress Notes (Signed)
Patient ID: Million Scott Crawford, male   DOB: 06/07/1956, 63 y.o.   MRN: [id number]   Virtual Visit via video Note  This visit type was conducted due to national recommendations for restrictions regarding the COVID-19 pandemic (e.g. social distancing).  This format is felt to be most appropriate for this patient at this time.  All issues noted in this document were discussed and addressed.  No physical exam was performed (except for noted visual exam findings with Video Visits).   I connected with Scott Crawford by a video enabled telemedicine application and verified that I am speaking with the correct person using two identifiers. Location patient: home Location provider: work Persons participating in the virtual visit: patient, provider  I discussed the limitations, risks, security and privacy concerns of performing an evaluation and management service by video and the availability of in person appointments. The patient expressed understanding and agreed to proceed.   Reason for visit: scheduled follow up.   HPI: He reports he is doing well.  Feels good.  Tries to stay active.  Discussed diet and exercise.  No chest pain. No sob.  No acid reflux. No abdominal pain.  Bowels moving.  Handling stress.  Declines to start cholesterol medication. hve discussed calculated cholesterol risk.  Needs labs.  Reports blood pressure doing well.     ROS: See pertinent positives and negatives per HPI.  Past Medical History:  Diagnosis Date  . Anxiety   . DDD (degenerative disc disease), lumbar   . Elevated PSA    Followed by Dr. Jacqlyn Larsen  . Epididymal cyst   . H/O gastroesophageal reflux (GERD)   . Herpes simplex   . History of hiatal hernia   . Hyperlipidemia   . Hypertension   . IBS (irritable bowel syndrome)   . Liver abscess   . Lumbar back pain    ruptured L5  . Obesity   . Panic disorder without agoraphobia     Past Surgical History:  Procedure Laterality Date  . CATARACT EXTRACTION  W/PHACO Right 09/10/2017   Procedure: CATARACT EXTRACTION PHACO AND INTRAOCULAR LENS PLACEMENT ([place])  RIGHT;  Surgeon: Leandrew Koyanagi, MD;  Location: [place];  Service: Ophthalmology;  Laterality: Right;  . COLONOSCOPY    . COLONOSCOPY WITH PROPOFOL N/A 06/21/2015   Procedure: COLONOSCOPY WITH PROPOFOL;  Surgeon: Hulen Luster, MD;  Location: [facility]COPY;  Service: Gastroenterology;  Laterality: N/A;  . ESOPHAGOGASTRODUODENOSCOPY    . 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 PROSTATE BIOPSY  2013   Dr. Jacqlyn Larsen, negative/inconclusive  . TONSILLECTOMY  1962   child    Family History  Problem Relation Age of Onset  . Cancer Mother        Thyroid cancer  . Hyperlipidemia Father   . Dementia Father   . Cancer Maternal Grandmother        leukemia  . Cancer Maternal Grandfather        colon, treated with surgical resection  . Cancer Brother        colon    SOCIAL HX: reviewed.    Current Outpatient Medications:  .  acetaminophen (TYLENOL) 325 MG tablet, Take by mouth., Disp: , Rfl:  .  allopurinol (ZYLOPRIM) 100 MG tablet, Take 100 mg by mouth daily., Disp: , Rfl: 11 .  amLODipine (NORVASC) 5 MG tablet, Take 1 tablet (5 mg total) by mouth daily., Disp: 90 tablet, Rfl: 3 .  busPIRone (BUSPAR) 5 MG tablet, One q  pm prn, Disp: 90 tablet, Rfl: 0 .  cyclobenzaprine (FLEXERIL) 10 MG tablet, Take 1 tablet (10 mg total) by mouth 3 (three) times daily as needed for muscle spasms., Disp: 90 tablet, Rfl: 3 .  finasteride (PROSCAR) 5 MG tablet, , Disp: , Rfl:  .  ibuprofen (ADVIL,MOTRIN) 200 MG tablet, Take by mouth., Disp: , Rfl:  .  losartan-hydrochlorothiazide (HYZAAR) 100-25 MG tablet, Take 1 tablet by mouth daily., Disp: 90 tablet, Rfl: 3 .  MELATONIN PO, Take by mouth., Disp: , Rfl:  .  Misc Natural Products (HERBAL ENERGY COMPLEX PO), Take by mouth., Disp: , Rfl:  .  Multiple Vitamin (MULTI-VITAMINS) TABS, Take by mouth., Disp: , Rfl:  .  omeprazole (PRILOSEC)  20 MG capsule, Take 20 mg by mouth daily., Disp: , Rfl:  .  pantoprazole (PROTONIX) 40 MG tablet, TAKE 1 TABLET(40 MG) BY MOUTH DAILY, Disp: 90 tablet, Rfl: 2 .  sildenafil (REVATIO) 20 MG tablet, 3-5 tablets po daily as needed, Disp: , Rfl:  .  valACYclovir (VALTREX) 500 MG tablet, Take 1 tablet (500 mg total) by mouth daily as needed (As needed for cold sores)., Disp: 90 tablet, Rfl: 3  EXAM:  VITALS per patient if applicable:  0000000, pulse 83  GENERAL: alert, oriented, appears well and in no acute distress  HEENT: atraumatic, conjunttiva clear, no obvious abnormalities on inspection of external nose and ears  NECK: normal movements of the head and neck  LUNGS: on inspection no signs of respiratory distress, breathing rate appears normal, no obvious gross SOB, gasping or wheezing  CV: no obvious cyanosis  PSYCH/NEURO: pleasant and cooperative, no obvious depression or anxiety, speech and thought processing grossly intact  ASSESSMENT AND PLAN:  Discussed the following assessment and plan:  Essential hypertension, benign Blood pressure as outlined.  Continue current medication regimen.  Follow pressures.  Follow metabolic panel. Discussed overdue for labs.  Will schedule.    Hypercholesteremia Have discussed calculated cholesterol risk and recommendation to start cholesterol medication.  He declines.  Low cholesterol diet and exercise.  Follow lipid panel.    Stress Discussed.  Handling stress.  Does not feel needs any further intervention.  Follow.      I discussed the assessment and treatment plan with the patient. The patient was provided an opportunity to ask questions and all were answered. The patient agreed with the plan and demonstrated an understanding of the instructions.   The patient was advised to call back or seek an in-person evaluation if the symptoms worsen or if the condition fails to improve as anticipated.   Einar Pheasant, MD

## 2019-07-06 ENCOUNTER — Encounter: Payer: Self-pay | Admitting: Internal Medicine

## 2019-07-06 MED ORDER — LOSARTAN POTASSIUM-HCTZ 100-25 MG PO TABS: 1.0000 | ORAL_TABLET | Freq: Every day | ORAL | 3 refills | Status: DC

## 2019-07-06 NOTE — Assessment & Plan Note (Signed)
Have discussed calculated cholesterol risk and recommendation to start cholesterol medication.  He declines.  Low cholesterol diet and exercise.  Follow lipid panel.

## 2019-07-06 NOTE — Assessment & Plan Note (Signed)
Discussed.  Handling stress.  Does not feel needs any further intervention.  Follow.

## 2019-07-06 NOTE — Assessment & Plan Note (Addendum)
Blood pressure as outlined.  Continue current medication regimen.  Follow pressures.  Follow metabolic panel. Discussed overdue for labs.  Will schedule.

## 2019-08-20 ENCOUNTER — Telehealth: Payer: Self-pay | Admitting: Internal Medicine

## 2019-08-20 NOTE — Telephone Encounter (Signed)
Pt aware.

## 2019-08-20 NOTE — Telephone Encounter (Signed)
Called pt to schedule a Doxy appt and he stated that he needed to be seen in office only because he is having blood in his semen and Urology will not see him until Jan

## 2019-08-20 NOTE — Telephone Encounter (Signed)
Moved appt up with urology. Pt will see them on Dec 15. Cancel appt with Dr Nicki Reaper on 12/2.

## 2019-08-26 ENCOUNTER — Ambulatory Visit: Payer: BLUE CROSS/BLUE SHIELD | Admitting: Internal Medicine

## 2019-09-10 ENCOUNTER — Other Ambulatory Visit: Payer: Self-pay

## 2019-09-10 ENCOUNTER — Encounter: Payer: Self-pay | Admitting: Internal Medicine

## 2019-09-10 MED ORDER — PANTOPRAZOLE SODIUM 40 MG PO TBEC: DELAYED_RELEASE_TABLET | ORAL | 2 refills | Status: DC

## 2019-09-10 MED ORDER — AMLODIPINE BESYLATE 5 MG PO TABS: 5.0000 mg | ORAL_TABLET | Freq: Every day | ORAL | 3 refills | Status: DC

## 2019-12-31 ENCOUNTER — Ambulatory Visit: Payer: Self-pay | Admitting: Internal Medicine

## 2020-05-20 ENCOUNTER — Other Ambulatory Visit: Payer: Self-pay | Admitting: Internal Medicine

## 2020-06-01 ENCOUNTER — Other Ambulatory Visit: Payer: Self-pay

## 2020-06-03 ENCOUNTER — Ambulatory Visit (INDEPENDENT_AMBULATORY_CARE_PROVIDER_SITE_OTHER): Payer: Medicare Other | Admitting: Internal Medicine

## 2020-06-03 ENCOUNTER — Other Ambulatory Visit: Payer: Self-pay

## 2020-06-03 VITALS — BP 130/70 | HR 87 | Temp 98.4°F | Resp 16 | Ht 67.0 in | Wt 275.0 lb

## 2020-06-03 DIAGNOSIS — F439 Reaction to severe stress, unspecified: Secondary | ICD-10-CM

## 2020-06-03 DIAGNOSIS — I10 Essential (primary) hypertension: Secondary | ICD-10-CM | POA: Diagnosis not present

## 2020-06-03 DIAGNOSIS — Z23 Encounter for immunization: Secondary | ICD-10-CM | POA: Diagnosis not present

## 2020-06-03 DIAGNOSIS — E7800 Pure hypercholesterolemia, unspecified: Secondary | ICD-10-CM

## 2020-06-03 DIAGNOSIS — E559 Vitamin D deficiency, unspecified: Secondary | ICD-10-CM

## 2020-06-03 DIAGNOSIS — K219 Gastro-esophageal reflux disease without esophagitis: Secondary | ICD-10-CM

## 2020-06-03 MED ORDER — CYCLOBENZAPRINE HCL 10 MG PO TABS
10.0000 mg | ORAL_TABLET | Freq: Every day | ORAL | 0 refills | Status: DC | PRN
Start: 2020-06-03 — End: 2020-06-28

## 2020-06-03 MED ORDER — FAMOTIDINE 40 MG PO TABS: 40.0000 mg | ORAL_TABLET | Freq: Every day | ORAL | 1 refills | Status: DC

## 2020-06-03 NOTE — Progress Notes (Signed)
Patient ID: Scott Crawford, male   DOB: 11-10-1955, 63 y.o.   MRN: [id number]   Subjective:    Patient ID: Scott Crawford, male    DOB: 13-Aug-1956, 64 y.o.   MRN: [id number]  HPI This visit occurred during the SARS-CoV-2 public health emergency.  Safety protocols were in place, including screening questions prior to the visit, additional usage of staff PPE, and extensive cleaning of exam room while observing appropriate contact time as indicated for disinfecting solutions.  Patient here for a scheduled follow up. Increased stress.  Stress with mother's health issues, etc.  Discussed.  Overall appears to be handling things relatively well.  Not watching his diet as well.  Discussed diet and exercise.  No chest pain or sob.  Has noticed acid reflux.  Wants a different medication other than protonix (PPI).  Discussed pepcid.  No abdominal pain.  Bowels moving.  Request refill flexeril.    Past Medical History:  Diagnosis Date  . Anxiety   . DDD (degenerative disc disease), lumbar   . Elevated PSA    Followed by Dr. Jacqlyn Larsen  . Epididymal cyst   . H/O gastroesophageal reflux (GERD)   . Herpes simplex   . History of hiatal hernia   . Hyperlipidemia   . Hypertension   . IBS (irritable bowel syndrome)   . Liver abscess   . Lumbar back pain    ruptured L5  . Obesity   . Panic disorder without agoraphobia    Past Surgical History:  Procedure Laterality Date  . CATARACT EXTRACTION W/PHACO Right 09/10/2017   Procedure: CATARACT EXTRACTION PHACO AND INTRAOCULAR LENS PLACEMENT ([place])  RIGHT;  Surgeon: Leandrew Koyanagi, MD;  Location: [place];  Service: Ophthalmology;  Laterality: Right;  . COLONOSCOPY    . COLONOSCOPY WITH PROPOFOL N/A 06/21/2015   Procedure: COLONOSCOPY WITH PROPOFOL;  Surgeon: Hulen Luster, MD;  Location: [facility] ENDOSCOPY;  Service: Gastroenterology;  Laterality: N/A;  . ESOPHAGOGASTRODUODENOSCOPY    . 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  PROSTATE BIOPSY  2013   Dr. Jacqlyn Larsen, negative/inconclusive  . TONSILLECTOMY  1962   child   Family History  Problem Relation Age of Onset  . Cancer Mother        Thyroid cancer  . Hyperlipidemia Father   . Dementia Father   . Cancer Maternal Grandmother        leukemia  . Cancer Maternal Grandfather        colon, treated with surgical resection  . Cancer Brother        colon   Social History   Socioeconomic History  . Marital status: Widowed    Spouse name: Not on file  . Number of children: Not on file  . Years of education: Not on file  . Highest education level: Not on file  Occupational History  . Not on file  Tobacco Use  . Smoking status: Never Smoker  . Smokeless tobacco: Never Used  Substance and Sexual Activity  . Alcohol use: Yes  . Drug use: No  . Sexual activity: Not on file  Other Topics Concern  . Not on file  Social History Narrative   Lives in [place] with wife. One daughter.      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 Worked for [employer] - regular      Exercise - limited   Social Determinants of Health  Financial Resource Strain:   . Difficulty of Paying Living Expenses: Not on file  Food Insecurity:   . Worried About Charity fundraiser in the Last Year: Not on file  . Ran Out of Food in the Last Year: Not on file  Transportation Needs:   . Lack of Transportation (Medical): Not on file  . Lack of Transportation (Non-Medical): Not on file  Physical Activity:   . Days of Exercise per Week: Not on file  . Minutes of Exercise per Session: Not on file  Stress:   . Feeling of Stress : Not on file  Social Connections:   . Frequency of Communication with Friends and Family: Not on file  . Frequency of Social Gatherings with Friends and Family: Not on file  . Attends Religious Services: Not on file  . Active Member of Clubs or Organizations: Not on file  . Attends Archivist Meetings: Not on file  . Marital  Status: Not on file    Outpatient Encounter Medications as of 06/03/2020  Medication Sig  . acetaminophen (TYLENOL) 325 MG tablet Take by mouth.  Marland Kitchen allopurinol (ZYLOPRIM) 100 MG tablet Take 100 mg by mouth daily.  Marland Kitchen amLODipine (NORVASC) 5 MG tablet Take 1 tablet (5 mg total) by mouth daily.  . busPIRone (BUSPAR) 5 MG tablet One q pm prn  . cyclobenzaprine (FLEXERIL) 10 MG tablet Take 1 tablet (10 mg total) by mouth daily as needed for muscle spasms.  . famotidine (PEPCID) 40 MG tablet Take 1 tablet (40 mg total) by mouth daily.  . finasteride (PROSCAR) 5 MG tablet   . ibuprofen (ADVIL,MOTRIN) 200 MG tablet Take by mouth.  . losartan-hydrochlorothiazide (HYZAAR) 100-25 MG tablet Take 1 tablet by mouth daily.  Marland Kitchen MELATONIN PO Take by mouth.  . Misc Natural Products (HERBAL ENERGY COMPLEX PO) Take by mouth.  . Multiple Vitamin (MULTI-VITAMINS) TABS Take by mouth.  . sildenafil (REVATIO) 20 MG tablet 3-5 tablets po daily as needed  . valACYclovir (VALTREX) 500 MG tablet Take 1 tablet (500 mg total) by mouth daily as needed (As needed for cold sores).  . [DISCONTINUED] cyclobenzaprine (FLEXERIL) 10 MG tablet Take 1 tablet (10 mg total) by mouth 3 (three) times daily as needed for muscle spasms.  . [DISCONTINUED] omeprazole (PRILOSEC) 20 MG capsule Take 20 mg by mouth daily.  . [DISCONTINUED] pantoprazole (PROTONIX) 40 MG tablet TAKE 1 TABLET(40 MG) BY MOUTH DAILY   No facility-administered encounter medications on file as of 06/03/2020.    Review of Systems  Constitutional: Negative for appetite change and unexpected weight change.  HENT: Negative for congestion and sinus pressure.   Respiratory: Negative for cough, chest tightness and shortness of breath.   Cardiovascular: Negative for chest pain, palpitations and leg swelling.  Gastrointestinal: Negative for abdominal pain, diarrhea, nausea and vomiting.  Genitourinary: Negative for difficulty urinating and dysuria.  Musculoskeletal:  Negative for joint swelling and myalgias.  Skin: Negative for color change and rash.  Neurological: Negative for dizziness, light-headedness and headaches.  Psychiatric/Behavioral: Negative for agitation and dysphoric mood.       Objective:    Physical Exam Vitals reviewed.  Constitutional:      General: He is not in acute distress.    Appearance: Normal appearance. He is well-developed.  HENT:     Head: Normocephalic and atraumatic.     Right Ear: External ear normal.     Left Ear: External ear normal.  Eyes:     General: No  scleral icterus.       Right eye: No discharge.        Left eye: No discharge.     Conjunctiva/sclera: Conjunctivae normal.  Cardiovascular:     Rate and Rhythm: Normal rate and regular rhythm.  Pulmonary:     Effort: Pulmonary effort is normal. No respiratory distress.     Breath sounds: Normal breath sounds.  Abdominal:     General: Bowel sounds are normal.     Palpations: Abdomen is soft.     Tenderness: There is no abdominal tenderness.  Musculoskeletal:        General: No swelling or tenderness.     Cervical back: Neck supple. No tenderness.  Lymphadenopathy:     Cervical: No cervical adenopathy.  Skin:    Findings: No erythema or rash.  Neurological:     Mental Status: He is alert.  Psychiatric:        Mood and Affect: Mood normal.        Behavior: Behavior normal.     BP 130/70   Pulse 87   Temp 98.4 F (36.9 C) (Oral)   Resp 16   Ht 5\' 7"  (1.702 m)   Wt 275 lb (124.7 kg)   SpO2 98%   BMI 43.07 kg/m  Wt Readings from Last 3 Encounters:  06/03/20 275 lb (124.7 kg)  07/02/19 253 lb (114.8 kg)  07/22/18 253 lb 12.8 oz (115.1 kg)     Lab Results  Component Value Date   WBC 6.4 06/25/2017   HGB 14.6 06/25/2017   HCT 44.4 06/25/2017   PLT 226.0 06/25/2017   GLUCOSE 103 (H) 12/26/2017   CHOL 162 12/26/2017   TRIG 107.0 12/26/2017   HDL 39.90 12/26/2017   LDLDIRECT 123.7 10/07/2009   LDLCALC 101 (H) 12/26/2017   ALT 16  12/26/2017   AST 15 12/26/2017   NA 140 12/26/2017   K 3.9 12/26/2017   CL 104 12/26/2017   CREATININE 1.03 12/26/2017   BUN 26 (H) 12/26/2017   CO2 28 12/26/2017   TSH 2.00 06/25/2017   PSA 2.16 10/07/2009   HGBA1C 5.5 02/25/2015   MICROALBUR <0.7 02/25/2015       Assessment & Plan:   Problem List Items Addressed This Visit    Vitamin D deficiency    Follow vitamin D level.        Stress    Increased stress as outlined.  Overall appears to be doing well.  Follow.        Hypercholesteremia    Have discussed calculated cholesterol risk and recommendation to start a cholesterol medication.  He declines.  Low cholesterol diet and exercise.  Follow lipid panel.        GERD (gastroesophageal reflux disease)    Has been on protonix.  Wants to change off PPI.  Start pepcid 40mg  q day.  Follow.        Relevant Medications   famotidine (PEPCID) 40 MG tablet   Essential hypertension, benign    Blood pressure as outlined on amlodipine and losartan/hctz.  Follow pressures.  Follow metabolic panel.         Other Visit Diagnoses    Need for immunization against influenza    -  Primary   Relevant Orders   Flu Vaccine QUAD High Dose(Fluad) (Completed)       Einar Pheasant, MD

## 2020-06-13 ENCOUNTER — Encounter: Payer: Self-pay | Admitting: Internal Medicine

## 2020-06-13 DIAGNOSIS — K219 Gastro-esophageal reflux disease without esophagitis: Secondary | ICD-10-CM | POA: Insufficient documentation

## 2020-06-13 NOTE — Assessment & Plan Note (Signed)
Blood pressure as outlined on amlodipine and losartan/hctz.  Follow pressures.  Follow metabolic panel.   

## 2020-06-13 NOTE — Assessment & Plan Note (Signed)
Follow vitamin D level.  

## 2020-06-13 NOTE — Assessment & Plan Note (Signed)
Have discussed calculated cholesterol risk and recommendation to start a cholesterol medication.  He declines.  Low cholesterol diet and exercise.  Follow lipid panel.

## 2020-06-13 NOTE — Assessment & Plan Note (Signed)
Has been on protonix.  Wants to change off PPI.  Start pepcid 40mg  q day.  Follow.

## 2020-06-13 NOTE — Assessment & Plan Note (Signed)
Increased stress as outlined.  Overall appears to be doing well.  Follow.

## 2020-06-14 ENCOUNTER — Other Ambulatory Visit: Payer: Self-pay | Admitting: Internal Medicine

## 2020-06-28 ENCOUNTER — Other Ambulatory Visit: Payer: Self-pay | Admitting: Internal Medicine

## 2020-06-28 ENCOUNTER — Encounter: Payer: Self-pay | Admitting: Internal Medicine

## 2020-06-30 NOTE — Telephone Encounter (Signed)
I have pended both RX's if ok to send.

## 2020-07-01 MED ORDER — FAMOTIDINE 40 MG PO TABS: 40.0000 mg | ORAL_TABLET | Freq: Every day | ORAL | 3 refills | Status: DC

## 2020-07-01 MED ORDER — CYCLOBENZAPRINE HCL 10 MG PO TABS: ORAL_TABLET | ORAL | 0 refills | Status: DC

## 2020-07-21 ENCOUNTER — Telehealth: Payer: Self-pay | Admitting: Internal Medicine

## 2020-07-21 DIAGNOSIS — Z1211 Encounter for screening for malignant neoplasm of colon: Secondary | ICD-10-CM

## 2020-07-21 NOTE — Telephone Encounter (Signed)
Patient is requesting a referral for a Colonoscopy  with Dr. Armandina Gemma . Please let patient know when referral is placed.

## 2020-07-22 NOTE — Telephone Encounter (Signed)
Are you ok to place referral for colonoscopy.

## 2020-07-23 NOTE — Telephone Encounter (Signed)
Order placed for GI referral - to Dr Allen Norris.

## 2020-07-23 NOTE — Telephone Encounter (Signed)
Patient aware.

## 2020-07-29 ENCOUNTER — Other Ambulatory Visit: Payer: Self-pay | Admitting: Internal Medicine

## 2020-07-30 ENCOUNTER — Telehealth (INDEPENDENT_AMBULATORY_CARE_PROVIDER_SITE_OTHER): Payer: Self-pay | Admitting: Gastroenterology

## 2020-07-30 ENCOUNTER — Other Ambulatory Visit: Payer: Self-pay

## 2020-07-30 DIAGNOSIS — Z800 Family history of malignant neoplasm of digestive organs: Secondary | ICD-10-CM

## 2020-07-30 DIAGNOSIS — Z1211 Encounter for screening for malignant neoplasm of colon: Secondary | ICD-10-CM

## 2020-07-30 MED ORDER — NA SULFATE-K SULFATE-MG SULF 17.5-3.13-1.6 GM/177ML PO SOLN: 1.0000 | Freq: Once | ORAL | 0 refills | Status: AC

## 2020-07-30 NOTE — Progress Notes (Signed)
Gastroenterology Pre-Procedure Review  Request Date: Tuesday 09/07/20 Requesting Physician: Dr. Danton Sewer  PATIENT REVIEW QUESTIONS: The patient responded to the following health history questions as indicated:    1. Are you having any GI issues? no 2. Do you have a personal history of Polyps? no 3. Do you have a family history of Colon Cancer or Polyps? Yes brother had colon cancer 4. Diabetes Mellitus? no 5. Joint replacements in the past 12 months?no 6. Major health problems in the past 3 months?no 7. Any artificial heart valves, MVP, or defibrillator?no    MEDICATIONS & ALLERGIES:    Patient reports the following regarding taking any anticoagulation/antiplatelet therapy:   Plavix, Coumadin, Eliquis, Xarelto, Lovenox, Pradaxa, Brilinta, or Effient? no Aspirin? no  Patient confirms/reports the following medications:  Current Outpatient Medications  Medication Sig Dispense Refill  . acetaminophen (TYLENOL) 325 MG tablet Take by mouth.    Marland Kitchen allopurinol (ZYLOPRIM) 100 MG tablet Take 100 mg by mouth daily.  11  . amLODipine (NORVASC) 5 MG tablet TAKE 1 TABLET(5 MG) BY MOUTH DAILY 90 tablet 3  . cyclobenzaprine (FLEXERIL) 10 MG tablet TAKE 1 TABLET(10 MG) BY MOUTH DAILY AS NEEDED FOR MUSCLE SPASMS 90 tablet 0  . famotidine (PEPCID) 40 MG tablet TAKE 1 TABLET(40 MG) BY MOUTH DAILY 30 tablet 1  . finasteride (PROSCAR) 5 MG tablet     . ibuprofen (ADVIL,MOTRIN) 200 MG tablet Take by mouth.    . losartan-hydrochlorothiazide (HYZAAR) 100-25 MG tablet Take 1 tablet by mouth daily. 90 tablet 3  . MELATONIN PO Take by mouth.    . Misc Natural Products (HERBAL ENERGY COMPLEX PO) Take by mouth.    . Multiple Vitamin (MULTI-VITAMINS) TABS Take by mouth.    . sildenafil (REVATIO) 20 MG tablet 3-5 tablets po daily as needed    . valACYclovir (VALTREX) 500 MG tablet Take 1 tablet (500 mg total) by mouth daily as needed (As needed for cold sores). 90 tablet 3  . busPIRone (BUSPAR) 5 MG tablet One q pm  prn (Patient not taking: Reported on 07/30/2020) 90 tablet 0  . Na Sulfate-K Sulfate-Mg Sulf 17.5-3.13-1.6 GM/177ML SOLN Take 1 kit by mouth once for 1 dose. 354 mL 0   No current facility-administered medications for this visit.    Patient confirms/reports the following allergies:  Allergies  Allergen Reactions  . Iodinated Diagnostic Agents Anaphylaxis, Shortness Of Breath and Swelling  . Ace Inhibitors     REACTION: Cough  . Penicillins     No orders of the defined types were placed in this encounter.   AUTHORIZATION INFORMATION Primary Insurance: 1D#: Group #:  Secondary Insurance: 1D#: Group #:  SCHEDULE INFORMATION: Date: Tuesday 09/07/20 Time: Location:[facility]

## 2020-08-03 ENCOUNTER — Other Ambulatory Visit: Payer: Self-pay

## 2020-08-03 NOTE — Progress Notes (Signed)
Patient requested date change for procedure. New instructions have been sent via my chart and mailed.

## 2020-09-02 ENCOUNTER — Encounter: Payer: Self-pay | Admitting: Gastroenterology

## 2020-09-02 ENCOUNTER — Other Ambulatory Visit: Payer: Self-pay

## 2020-09-08 ENCOUNTER — Other Ambulatory Visit
Admission: RE | Admit: 2020-09-08 | Discharge: 2020-09-08 | Disposition: A | Discharge status: Home / Self Care | Payer: Medicare Other | Source: Ambulatory Visit | Attending: Gastroenterology | Admitting: Gastroenterology

## 2020-09-08 DIAGNOSIS — Z20822 Contact with and (suspected) exposure to covid-19: Secondary | ICD-10-CM | POA: Diagnosis not present

## 2020-09-08 DIAGNOSIS — Z01812 Encounter for preprocedural laboratory examination: Secondary | ICD-10-CM | POA: Diagnosis present

## 2020-09-08 LAB — SARS CORONAVIRUS 2 (TAT 6-24 HRS): SARS Coronavirus 2: NEGATIVE

## 2020-09-09 NOTE — Discharge Instructions (Signed)
General Anesthesia, Adult, Care After This sheet gives you information about how to care for yourself after your procedure. Your health care provider may also give you more specific instructions. If you have problems or questions, contact your health care provider. What can I expect after the procedure? After the procedure, the following side effects are common:  Pain or discomfort at the IV site.  Nausea.  Vomiting.  Sore throat.  Trouble concentrating.  Feeling cold or chills.  Weak or tired.  Sleepiness and fatigue.  Soreness and body aches. These side effects can affect parts of the body that were not involved in surgery. Follow these instructions at home:  For at least 24 hours after the procedure:  Have a responsible adult stay with you. It is important to have someone help care for you until you are awake and alert.  Rest as needed.  Do not: ? Participate in activities in which you could fall or become injured. ? Drive. ? Use heavy machinery. ? Drink alcohol. ? Take sleeping pills or medicines that cause drowsiness. ? Make important decisions or sign legal documents. ? Take care of children on your own. Eating and drinking  Follow any instructions from your health care provider about eating or drinking restrictions.  When you feel hungry, start by eating small amounts of foods that are soft and easy to digest (bland), such as toast. Gradually return to your regular diet.  Drink enough fluid to keep your urine pale yellow.  If you vomit, rehydrate by drinking water, juice, or clear broth. General instructions  If you have sleep apnea, surgery and certain medicines can increase your risk for breathing problems. Follow instructions from your health care provider about wearing your sleep device: ? Anytime you are sleeping, including during daytime naps. ? While taking prescription pain medicines, sleeping medicines, or medicines that make you drowsy.  Return to  your normal activities as told by your health care provider. Ask your health care provider what activities are safe for you.  Take over-the-counter and prescription medicines only as told by your health care provider.  If you smoke, do not smoke without supervision.  Keep all follow-up visits as told by your health care provider. This is important. Contact a health care provider if:  You have nausea or vomiting that does not get better with medicine.  You cannot eat or drink without vomiting.  You have pain that does not get better with medicine.  You are unable to pass urine.  You develop a skin rash.  You have a fever.  You have redness around your IV site that gets worse. Get help right away if:  You have difficulty breathing.  You have chest pain.  You have blood in your urine or stool, or you vomit blood. Summary  After the procedure, it is common to have a sore throat or nausea. It is also common to feel tired.  Have a responsible adult stay with you for the first 24 hours after general anesthesia. It is important to have someone help care for you until you are awake and alert.  When you feel hungry, start by eating small amounts of foods that are soft and easy to digest (bland), such as toast. Gradually return to your regular diet.  Drink enough fluid to keep your urine pale yellow.  Return to your normal activities as told by your health care provider. Ask your health care provider what activities are safe for you. This information is not   intended to replace advice given to you by your health care provider. Make sure you discuss any questions you have with your health care provider. Document Revised: 09/14/2017 Document Reviewed: 04/27/2017 Elsevier Patient Education  2020 [employer]  

## 2020-09-10 ENCOUNTER — Ambulatory Visit: Payer: Medicare Other | Admitting: Anesthesiology

## 2020-09-10 ENCOUNTER — Encounter
Admission: RE | Disposition: A | Discharge status: Home / Self Care | Payer: Self-pay | Source: Home / Self Care | Attending: Gastroenterology

## 2020-09-10 ENCOUNTER — Ambulatory Visit
Admission: RE | Admit: 2020-09-10 | Discharge: 2020-09-10 | Disposition: A | Discharge status: Home / Self Care | Payer: Medicare Other | Attending: Gastroenterology | Admitting: Gastroenterology

## 2020-09-10 ENCOUNTER — Encounter: Payer: Self-pay | Admitting: Gastroenterology

## 2020-09-10 ENCOUNTER — Other Ambulatory Visit: Payer: Self-pay

## 2020-09-10 DIAGNOSIS — Z800 Family history of malignant neoplasm of digestive organs: Secondary | ICD-10-CM

## 2020-09-10 DIAGNOSIS — Z79899 Other long term (current) drug therapy: Secondary | ICD-10-CM | POA: Diagnosis not present

## 2020-09-10 DIAGNOSIS — K5730 Diverticulosis of large intestine without perforation or abscess without bleeding: Secondary | ICD-10-CM | POA: Diagnosis not present

## 2020-09-10 DIAGNOSIS — Z87892 Personal history of anaphylaxis: Secondary | ICD-10-CM | POA: Insufficient documentation

## 2020-09-10 DIAGNOSIS — K640 First degree hemorrhoids: Secondary | ICD-10-CM | POA: Insufficient documentation

## 2020-09-10 DIAGNOSIS — Z888 Allergy status to other drugs, medicaments and biological substances status: Secondary | ICD-10-CM | POA: Insufficient documentation

## 2020-09-10 DIAGNOSIS — Z1211 Encounter for screening for malignant neoplasm of colon: Secondary | ICD-10-CM

## 2020-09-10 DIAGNOSIS — Z880 Allergy status to penicillin: Secondary | ICD-10-CM | POA: Insufficient documentation

## 2020-09-10 DIAGNOSIS — Z91041 Radiographic dye allergy status: Secondary | ICD-10-CM | POA: Diagnosis not present

## 2020-09-10 HISTORY — PX: COLONOSCOPY WITH PROPOFOL: SHX5780

## 2020-09-10 HISTORY — DX: Presence of external hearing-aid: Z97.4

## 2020-09-10 HISTORY — DX: Motion sickness, initial encounter: T75.3XXA

## 2020-09-10 SURGERY — COLONOSCOPY WITH PROPOFOL
Anesthesia: General

## 2020-09-10 MED ORDER — LACTATED RINGERS IV SOLN: INTRAVENOUS | Status: DC

## 2020-09-10 MED ORDER — LACTATED RINGERS IV SOLN: INTRAVENOUS | Status: DC | PRN

## 2020-09-10 MED ORDER — OXYCODONE HCL 5 MG PO TABS: 5.0000 mg | ORAL_TABLET | Freq: Once | ORAL | Status: DC | PRN

## 2020-09-10 MED ORDER — FENTANYL CITRATE (PF) 100 MCG/2ML IJ SOLN: 25.0000 ug | INTRAMUSCULAR | Status: DC | PRN

## 2020-09-10 MED ORDER — MEPERIDINE HCL 25 MG/ML IJ SOLN: 6.2500 mg | INTRAMUSCULAR | Status: DC | PRN

## 2020-09-10 MED ORDER — PROMETHAZINE HCL 25 MG/ML IJ SOLN: 6.2500 mg | INTRAMUSCULAR | Status: DC | PRN

## 2020-09-10 MED ORDER — OXYCODONE HCL 5 MG/5ML PO SOLN: 5.0000 mg | Freq: Once | ORAL | Status: DC | PRN

## 2020-09-10 MED ORDER — SODIUM CHLORIDE 0.9 % IV SOLN: INTRAVENOUS | Status: DC

## 2020-09-10 MED ORDER — PROPOFOL 10 MG/ML IV BOLUS
INTRAVENOUS | Status: DC | PRN
  Administered 2020-09-10: 20 mg via INTRAVENOUS
  Administered 2020-09-10: 150 mg via INTRAVENOUS
  Administered 2020-09-10: 30 mg via INTRAVENOUS

## 2020-09-10 SURGICAL SUPPLY — 25 items
CLIP HMST 235XBRD CATH ROT (MISCELLANEOUS) IMPLANT
CLIP RESOLUTION 360 11X235 (MISCELLANEOUS)
ELECT REM PT RETURN 9FT ADLT (ELECTROSURGICAL)
ELECTRODE REM PT RTRN 9FT ADLT (ELECTROSURGICAL) IMPLANT
FCP ESCP3.2XJMB 240X2.8X (MISCELLANEOUS)
FORCEPS BIOP RAD 4 LRG CAP 4 (CUTTING FORCEPS) IMPLANT
FORCEPS BIOP RJ4 240 W/NDL (MISCELLANEOUS)
FORCEPS ESCP3.2XJMB 240X2.8X (MISCELLANEOUS) IMPLANT
GOWN CVR UNV OPN BCK APRN NK (MISCELLANEOUS) ×2 IMPLANT
GOWN ISOL THUMB LOOP REG UNIV (MISCELLANEOUS) ×4
INJECTOR VARIJECT VIN23 (MISCELLANEOUS) IMPLANT
KIT DEFENDO VALVE AND CONN (KITS) IMPLANT
KIT PRC NS LF DISP ENDO (KITS) ×1 IMPLANT
KIT PROCEDURE OLYMPUS (KITS) ×2
MANIFOLD NEPTUNE II (INSTRUMENTS) ×2 IMPLANT
MARKER SPOT ENDO TATTOO 5ML (MISCELLANEOUS) IMPLANT
PROBE APC STR FIRE (PROBE) IMPLANT
RETRIEVER NET ROTH 2.5X230 LF (MISCELLANEOUS) IMPLANT
SNARE SHORT THROW 13M SML OVAL (MISCELLANEOUS) IMPLANT
SNARE SHORT THROW 30M LRG OVAL (MISCELLANEOUS) IMPLANT
SNARE SNG USE RND 15MM (INSTRUMENTS) IMPLANT
SPOT EX ENDOSCOPIC TATTOO (MISCELLANEOUS)
TRAP ETRAP POLY (MISCELLANEOUS) IMPLANT
VARIJECT INJECTOR VIN23 (MISCELLANEOUS)
WATER STERILE IRR 250ML POUR (IV SOLUTION) ×2 IMPLANT

## 2020-09-10 NOTE — Transfer of Care (Signed)
Immediate Anesthesia Transfer of Care Note  Patient: Scott Crawford  Procedure(s) Performed: COLONOSCOPY WITH PROPOFOL (N/A )  Patient Location: PACU  Anesthesia Type: General  Level of Consciousness: awake, alert  and patient cooperative  Airway and Oxygen Therapy: Patient Spontanous Breathing and Patient connected to supplemental oxygen  Post-op Assessment: Post-op Vital signs reviewed, Patient's Cardiovascular Status Stable, Respiratory Function Stable, Patent Airway and No signs of Nausea or vomiting  Post-op Vital Signs: Reviewed and stable  Complications: No complications documented.

## 2020-09-10 NOTE — Anesthesia Procedure Notes (Signed)
Date/Time: 09/10/2020 11:51 AM Performed by: Cameron Ali, CRNA Pre-anesthesia Checklist: Patient identified, Emergency Drugs available, Suction available, Timeout performed and Patient being monitored Patient Re-evaluated:Patient Re-evaluated prior to induction Oxygen Delivery Method: Nasal cannula Placement Confirmation: positive ETCO2

## 2020-09-10 NOTE — Op Note (Addendum)
[facility] Gastroenterology Patient Name: Scott Crawford Procedure Date: 09/10/2020 11:45 AM MRN: [id number] Account #: [id number] Date of Birth: April 20, 1956 Admit Type: Outpatient Age: 64 Room: [facility] OR ROOM 01 Gender: Male Note Status: Supervisor Override Procedure:             Colonoscopy Indications:           Screening for colorectal malignant neoplasm Providers:             [name] MD, MD Referring MD:          Einar Pheasant, MD (Referring MD) Medicines:             Propofol per Anesthesia Complications:         No immediate complications. Procedure:             Pre-Anesthesia Assessment:                        - Prior to the procedure, a History and Physical was                         performed, and patient medications and allergies were                         reviewed. The patient's tolerance of previous                         anesthesia was also reviewed. The risks and benefits                         of the procedure and the sedation options and risks                         were discussed with the patient. All questions were                         answered, and informed consent was obtained. Prior                         Anticoagulants: The patient has taken no previous                         anticoagulant or antiplatelet agents. ASA Grade                         Assessment: II - A patient with mild systemic disease.                         After reviewing the risks and benefits, the patient                         was deemed in satisfactory condition to undergo the                         procedure.                        After obtaining informed consent, the colonoscope was  passed under direct vision. Throughout the procedure,                         the patient's blood pressure, pulse, and oxygen                         saturations were monitored continuously. The was                         introduced through the  anus and advanced to the the                         cecum, identified by appendiceal orifice and ileocecal                         valve. The colonoscopy was performed without                         difficulty. The patient tolerated the procedure well.                         The quality of the bowel preparation was excellent. Findings:      The perianal and digital rectal examinations were normal.      Multiple small-mouthed diverticula were found in the sigmoid colon,       descending colon and transverse colon.      Non-bleeding internal hemorrhoids were found during retroflexion. The       hemorrhoids were Grade I (internal hemorrhoids that do not prolapse). Impression:            - Diverticulosis in the sigmoid colon, in the                         descending colon and in the transverse colon.                        - Non-bleeding internal hemorrhoids.                        - No specimens collected. Recommendation:        - Discharge patient to home.                        - Resume previous diet.                        - Continue present medications.                        - Repeat colonoscopy in 5 years for surveillance. Procedure Code(s):     --- Professional ---                        [phone], Colonoscopy, flexible; diagnostic, including                         collection of specimen(s) by brushing or washing, when                         performed (separate procedure) Diagnosis Code(s):     ---  Professional ---                        Z12.11, Encounter for screening for malignant neoplasm                         of colon CPT copyright 2019 American Medical Association. All rights reserved. The codes documented in this report are preliminary and upon coder review may  be revised to meet current compliance requirements. [name] MD, MD 09/10/2020 12:07:01 PM This report has been signed electronically. Number of Addenda: 0 Note Initiated On: 09/10/2020 11:45 AM Scope  Withdrawal Time: 0 hours 7 minutes 20 seconds  Total Procedure Duration: 0 hours 9 minutes 31 seconds  Estimated Blood Loss:  Estimated blood loss: none. Estimated blood loss: none.      [facility]

## 2020-09-10 NOTE — Anesthesia Postprocedure Evaluation (Signed)
Anesthesia Post Note  Patient: Scott Crawford  Procedure(s) Performed: COLONOSCOPY WITH PROPOFOL (N/A )     Patient location during evaluation: PACU Anesthesia Type: General Level of consciousness: awake and alert Pain management: pain level controlled Vital Signs Assessment: post-procedure vital signs reviewed and stable Respiratory status: spontaneous breathing, nonlabored ventilation, respiratory function stable and patient connected to nasal cannula oxygen Cardiovascular status: blood pressure returned to baseline and stable Postop Assessment: no apparent nausea or vomiting Anesthetic complications: no   No complications documented.  Lennox Dolberry, Glade Stanford

## 2020-09-10 NOTE — Anesthesia Preprocedure Evaluation (Signed)
Anesthesia Evaluation  Patient identified by MRN, date of birth, ID band Patient awake    Reviewed: Allergy & Precautions, H&P , NPO status , Patient's Chart, lab work & pertinent test results, reviewed documented beta blocker date and time   Airway Mallampati: II  TM Distance: >3 FB Neck ROM: full    Dental no notable dental hx.    Pulmonary neg pulmonary ROS,    Pulmonary exam normal breath sounds clear to auscultation       Cardiovascular Exercise Tolerance: Good hypertension, negative cardio ROS   Rhythm:regular Rate:Normal     Neuro/Psych PSYCHIATRIC DISORDERS Anxiety negative neurological ROS     GI/Hepatic Neg liver ROS, hiatal hernia, GERD  ,  Endo/Other  negative endocrine ROS  Renal/GU Renal diseasenegative Renal ROS  negative genitourinary   Musculoskeletal   Abdominal   Peds  Hematology negative hematology ROS (+)   Anesthesia Other Findings   Reproductive/Obstetrics negative OB ROS                             Anesthesia Physical Anesthesia Plan  ASA: II  Anesthesia Plan: General   Post-op Pain Management:    Induction:   PONV Risk Score and Plan: 2  Airway Management Planned:   Additional Equipment:   Intra-op Plan:   Post-operative Plan:   Informed Consent: I have reviewed the patients History and Physical, chart, labs and discussed the procedure including the risks, benefits and alternatives for the proposed anesthesia with the patient or authorized representative who has indicated his/her understanding and acceptance.     Dental Advisory Given  Plan Discussed with: CRNA  Anesthesia Plan Comments:         Anesthesia Quick Evaluation

## 2020-09-10 NOTE — H&P (Signed)
Scott Lame, MD [address] [place], [place] [address] Phone: [phone] Fax : [phone]  Primary Care Physician:  Einar Pheasant, MD Primary Gastroenterologist:  Dr. Allen Norris  Pre-Procedure History & Physical: HPI:  Scott Crawford is a 64 y.o. male is here for a screening colonoscopy.   Past Medical History:  Diagnosis Date  . Anxiety   . DDD (degenerative disc disease), lumbar   . Elevated PSA    Followed by Dr. Jacqlyn Larsen  . Epididymal cyst   . H/O gastroesophageal reflux (GERD)   . Herpes simplex   . History of hiatal hernia   . Hyperlipidemia   . Hypertension   . IBS (irritable bowel syndrome)   . Liver abscess   . Lumbar back pain    ruptured L5  . Motion sickness    deep sea fishing  . Obesity   . Panic disorder without agoraphobia   . Wears hearing aid in both ears     Past Surgical History:  Procedure Laterality Date  . CATARACT EXTRACTION W/PHACO Right 09/10/2017   Procedure: CATARACT EXTRACTION PHACO AND INTRAOCULAR LENS PLACEMENT ([place])  RIGHT;  Surgeon: Leandrew Koyanagi, MD;  Location: [place];  Service: Ophthalmology;  Laterality: Right;  . COLONOSCOPY    . COLONOSCOPY WITH PROPOFOL N/A 06/21/2015   Procedure: COLONOSCOPY WITH PROPOFOL;  Surgeon: Hulen Luster, MD;  [facility] ENDOSCOPY;  Service: Gastroenterology;  Laterality: N/A;  . ESOPHAGOGASTRODUODENOSCOPY    . 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 PROSTATE BIOPSY  2013   Dr. Jacqlyn Larsen, negative/inconclusive  . TONSILLECTOMY  1962   child    Prior to Admission medications   Medication Sig Start Date End Date Taking? Authorizing Provider  acetaminophen (TYLENOL) 325 MG tablet Take by mouth.   Yes [provider]  allopurinol (ZYLOPRIM) 100 MG tablet Take 100 mg by mouth daily. 06/14/16  Yes [provider]  amLODipine (NORVASC) 5 MG tablet TAKE 1 TABLET(5 MG) BY MOUTH DAILY 06/14/20  Yes Einar Pheasant, MD  cyclobenzaprine  (FLEXERIL) 10 MG tablet TAKE 1 TABLET(10 MG) BY MOUTH DAILY AS NEEDED FOR MUSCLE SPASMS 07/01/20  Yes Einar Pheasant, MD  famotidine (PEPCID) 40 MG tablet TAKE 1 TABLET(40 MG) BY MOUTH DAILY 07/29/20  Yes Einar Pheasant, MD  finasteride (PROSCAR) 5 MG tablet  05/21/13  Yes [provider]  ibuprofen (ADVIL,MOTRIN) 200 MG tablet Take by mouth.   Yes [provider]  losartan-hydrochlorothiazide (HYZAAR) 100-25 MG tablet Take 1 tablet by mouth daily. 07/06/19  Yes Einar Pheasant, MD  MELATONIN PO Take by mouth.   Yes [provider]  Misc Natural Products (HERBAL ENERGY COMPLEX PO) Take by mouth.   Yes [provider]  Multiple Vitamin (MULTI-VITAMINS) TABS Take by mouth.   Yes [provider]  sildenafil (REVATIO) 20 MG tablet 3-5 tablets po daily as needed 05/26/16  Yes [provider]  valACYclovir (VALTREX) 500 MG tablet Take 1 tablet (500 mg total) by mouth daily as needed (As needed for cold sores). 12/23/18  Yes Einar Pheasant, MD  busPIRone (BUSPAR) 5 MG tablet One q pm prn Patient not taking: Reported on 09/02/2020 07/22/18   Einar Pheasant, MD    Allergies as of 07/30/2020 - Review Complete 07/30/2020  Allergen Reaction Noted  . Iodinated diagnostic agents Anaphylaxis, Shortness Of Breath, and Swelling 11/27/2011  . Ace inhibitors  10/07/2009  . Penicillins  06/18/2015    Family History  Problem Relation  Age of Onset  . Cancer Mother        Thyroid cancer  . Hyperlipidemia Father   . Dementia Father   . Cancer Maternal Grandmother        leukemia  . Cancer Maternal Grandfather        colon, treated with surgical resection  . Cancer Brother        colon    Social History   Socioeconomic History  . Marital status: Widowed    Spouse name: Not on file  . Number of children: Not on file  . Years of education: Not on file  . Highest education level: Not on file  Occupational History  . Not on file  Tobacco Use  .  Smoking status: Never Smoker  . Smokeless tobacco: Never Used  Vaping Use  . Vaping Use: Never used  Substance and Sexual Activity  . Alcohol use: Yes    Comment: rare  . Drug use: No  . Sexual activity: Not on file  Other Topics Concern  . Not on file  Social History Narrative   Lives in [place] with wife. One daughter.      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 Neck:  Supple; no masses or thyromegaly. Lungs:  Clear throughout to auscultation.    Heart:  Regular rate and rhythm. Abdomen:  Soft, nontender and nondistended. Normal bowel sounds, without guarding, and without rebound.   Neurologic:  Alert and  oriented x4;  grossly normal neurologically.  Impression/Plan: Scott Crawford is now here to undergo a screening colonoscopy.  Risks, benefits, and alternatives regarding colonoscopy have been reviewed with the patient.  Questions have been answered.  All parties agreeable.

## 2020-09-13 ENCOUNTER — Encounter: Payer: Self-pay | Admitting: Gastroenterology

## 2020-09-17 ENCOUNTER — Other Ambulatory Visit: Payer: Self-pay | Admitting: Internal Medicine

## 2020-09-21 ENCOUNTER — Telehealth: Payer: Self-pay | Admitting: Internal Medicine

## 2020-09-21 NOTE — Telephone Encounter (Signed)
Left message for covid infusion toll free number.

## 2020-09-21 NOTE — Telephone Encounter (Signed)
Pt wife called he tested positive for Covid last night with an at home test  Pt had sudden onset of symptoms last night - fever, chills, sore throat and body pain   They would like to get the infusion  Please contact pt wife

## 2020-09-21 NOTE — Telephone Encounter (Signed)
Agree Dr. Katniss Weedman McLean-Scocuzza  

## 2021-05-27 ENCOUNTER — Telehealth: Payer: Self-pay | Admitting: Internal Medicine

## 2021-05-27 NOTE — Telephone Encounter (Signed)
Patient is calling in to see what type of thyroid cancer his mother had.Her name is Scott Crawford MRN [id number] and she was a patient of Dr.Scott's before she passed away,please advise.

## 2021-05-31 NOTE — Telephone Encounter (Signed)
It was not specified in patients chart what type of thyroid cancer she had. Scott Crawford is going to reach out to Dr [employer] office- who followed his moms thyroid cancer.

## 2021-06-08 ENCOUNTER — Other Ambulatory Visit: Payer: Self-pay | Admitting: Internal Medicine

## 2021-06-13 ENCOUNTER — Telehealth: Payer: Self-pay | Admitting: Internal Medicine

## 2021-06-13 MED ORDER — FAMOTIDINE 40 MG PO TABS: ORAL_TABLET | ORAL | 1 refills | Status: DC

## 2021-06-13 NOTE — Addendum Note (Signed)
Addended by: Elpidio Galea T on: 06/13/2021 11:10 AM   Modules accepted: Orders

## 2021-06-13 NOTE — Telephone Encounter (Signed)
Patient's famotidine (PEPCID) 40 MG tablet was sent to wrong pharmacy. His pharmacy is [place]. Please Remove [facility] in [place].

## 2021-06-16 ENCOUNTER — Other Ambulatory Visit: Payer: Self-pay | Admitting: Internal Medicine

## 2021-10-12 ENCOUNTER — Other Ambulatory Visit: Payer: Self-pay

## 2021-10-12 ENCOUNTER — Encounter: Payer: Self-pay | Admitting: Internal Medicine

## 2021-10-12 ENCOUNTER — Ambulatory Visit (INDEPENDENT_AMBULATORY_CARE_PROVIDER_SITE_OTHER): Payer: Medicare Other | Admitting: Internal Medicine

## 2021-10-12 DIAGNOSIS — I10 Essential (primary) hypertension: Secondary | ICD-10-CM | POA: Diagnosis not present

## 2021-10-12 DIAGNOSIS — R739 Hyperglycemia, unspecified: Secondary | ICD-10-CM | POA: Insufficient documentation

## 2021-10-12 DIAGNOSIS — Z0000 Encounter for general adult medical examination without abnormal findings: Secondary | ICD-10-CM | POA: Diagnosis not present

## 2021-10-12 DIAGNOSIS — Z20822 Contact with and (suspected) exposure to covid-19: Secondary | ICD-10-CM | POA: Insufficient documentation

## 2021-10-12 DIAGNOSIS — E559 Vitamin D deficiency, unspecified: Secondary | ICD-10-CM

## 2021-10-12 DIAGNOSIS — E7800 Pure hypercholesterolemia, unspecified: Secondary | ICD-10-CM

## 2021-10-12 LAB — CBC WITH DIFFERENTIAL/PLATELET
Basophils Absolute: 0 10*3/uL (ref 0.0–0.1)
Basophils Relative: 0.5 % (ref 0.0–3.0)
Eosinophils Absolute: 0.3 10*3/uL (ref 0.0–0.7)
Eosinophils Relative: 4 % (ref 0.0–5.0)
HCT: 44.9 % (ref 39.0–52.0)
Hemoglobin: 14.6 g/dL (ref 13.0–17.0)
Lymphocytes Relative: 16.4 % (ref 12.0–46.0)
Lymphs Abs: 1.1 10*3/uL (ref 0.7–4.0)
MCHC: 32.6 g/dL (ref 30.0–36.0)
MCV: 87.3 fl (ref 78.0–100.0)
Monocytes Absolute: 0.5 10*3/uL (ref 0.1–1.0)
Monocytes Relative: 7.4 % (ref 3.0–12.0)
Neutro Abs: 5 10*3/uL (ref 1.4–7.7)
Neutrophils Relative %: 71.7 % (ref 43.0–77.0)
Platelets: 231 10*3/uL (ref 150.0–400.0)
RBC: 5.14 Mil/uL (ref 4.22–5.81)
RDW: 14 % (ref 11.5–15.5)
WBC: 7 10*3/uL (ref 4.0–10.5)

## 2021-10-12 LAB — BASIC METABOLIC PANEL
BUN: 22 mg/dL (ref 6–23)
CO2: 28 mEq/L (ref 19–32)
Calcium: 9.3 mg/dL (ref 8.4–10.5)
Chloride: 105 mEq/L (ref 96–112)
Creatinine, Ser: 1.04 mg/dL (ref 0.40–1.50)
GFR: 74.27 mL/min (ref >60.00)
Glucose, Bld: 101 mg/dL — ABNORMAL HIGH (ref 70–99)
Potassium: 4.4 mEq/L (ref 3.5–5.1)
Sodium: 142 mEq/L (ref 135–145)

## 2021-10-12 LAB — HEPATIC FUNCTION PANEL
ALT: 17 U/L (ref 0–53)
AST: 14 U/L (ref 0–37)
Albumin: 4.1 g/dL (ref 3.5–5.2)
Alkaline Phosphatase: 59 U/L (ref 39–117)
Bilirubin, Direct: 0.1 mg/dL (ref 0.0–0.3)
Total Bilirubin: 0.5 mg/dL (ref 0.2–1.2)
Total Protein: 6.6 g/dL (ref 6.0–8.3)

## 2021-10-12 LAB — LIPID PANEL
Cholesterol: 191 mg/dL (ref 0–200)
HDL: 49.6 mg/dL (ref >39.00)
LDL Cholesterol: 127 mg/dL — ABNORMAL HIGH (ref 0–99)
NonHDL: 141.05
Total CHOL/HDL Ratio: 4
Triglycerides: 68 mg/dL (ref 0.0–149.0)
VLDL: 13.6 mg/dL (ref 0.0–40.0)

## 2021-10-12 LAB — VITAMIN D 25 HYDROXY (VIT D DEFICIENCY, FRACTURES): VITD: 28.1 ng/mL — ABNORMAL LOW (ref 30.00–100.00)

## 2021-10-12 LAB — TSH: TSH: 2.34 u[IU]/mL (ref 0.35–5.50)

## 2021-10-12 LAB — HEMOGLOBIN A1C: Hgb A1c MFr Bld: 5.8 % (ref 4.6–6.5)

## 2021-10-12 NOTE — Assessment & Plan Note (Signed)
Blood pressure as outlined on amlodipine and losartan/hctz.  Follow pressures.  Follow metabolic panel.   

## 2021-10-12 NOTE — Assessment & Plan Note (Signed)
Physical today 10/12/21.  Colonoscopy 05/2015.  Had recommended f/u in 5 years.  Follow up colonoscopy 08/2020 - internal hemorrhoids and diverticulosis.  Recommended f/u in 5 years.  Planning to see Dr Bernardo Heater next month for prostate followup and will get PSA checked through them.

## 2021-10-12 NOTE — Assessment & Plan Note (Signed)
Low carb diet and exercise.  Check a1c.   

## 2021-10-12 NOTE — Progress Notes (Signed)
Patient ID: Scott Crawford, male   DOB: 08/07/1956, 66 y.o.   MRN: [id number]   Subjective:    Patient ID: Scott Crawford, male    DOB: 1956/01/01, 66 y.o.   MRN: [id number]  This visit occurred during the SARS-CoV-2 public health emergency.  Safety protocols were in place, including screening questions prior to the visit, additional usage of staff PPE, and extensive cleaning of exam room while observing appropriate contact time as indicated for disinfecting solutions.     HPI History of hypertension and hypercholesterolemia.  Comes in today to follow up on these issues as well as for a complete physical exam.  He is doing well.  Feels good.  Trying to stay active.  No chest pain or sob reported.  No abdominal pain or bowel change reported.  Just returned from a cruise.  Wife has covid (diagnosed approximately 6 days ago).  He reports no symptoms.  States has tested everyday the last several days and tests have been negative.     Past Medical History:  Diagnosis Date   Anxiety    DDD (degenerative disc disease), lumbar    Elevated PSA    Followed by Dr. Jacqlyn Larsen   Epididymal cyst    H/O gastroesophageal reflux (GERD)    Herpes simplex    History of hiatal hernia    Hyperlipidemia    Hypertension    IBS (irritable bowel syndrome)    Liver abscess    Lumbar back pain    ruptured L5   Motion sickness    deep sea fishing   Obesity    Panic disorder without agoraphobia    Wears hearing aid in both ears    Past Surgical History:  Procedure Laterality Date   CATARACT EXTRACTION W/PHACO Right 09/10/2017   Procedure: CATARACT EXTRACTION PHACO AND INTRAOCULAR LENS PLACEMENT ([place])  RIGHT;  Surgeon: Leandrew Koyanagi, MD;  Location: [place];  Service: Ophthalmology;  Laterality: Right;   COLONOSCOPY     COLONOSCOPY WITH PROPOFOL N/A 06/21/2015   Procedure: COLONOSCOPY WITH PROPOFOL;  Surgeon: Hulen Luster, MD;  Location: [facility] ENDOSCOPY;  Service: Gastroenterology;   Laterality: N/A;   COLONOSCOPY WITH PROPOFOL N/A 09/10/2020   Procedure: COLONOSCOPY WITH PROPOFOL;  Surgeon: [name], MD;  Location: [place];  Service: Endoscopy;  Laterality: N/A;   ESOPHAGOGASTRODUODENOSCOPY     LIVER CYST REMOVAL     fusobacterium, 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 Jacqlyn Larsen, negative/inconclusive   [place]   child   Family History  Problem Relation Age of Onset   Cancer Mother        Thyroid cancer   Hyperlipidemia Father    Dementia Father    Cancer Maternal Grandmother        leukemia   Cancer Maternal Grandfather        colon, treated with surgical resection   Cancer Brother        colon   Social History   Socioeconomic History   Marital status: Married    Spouse name: Not on file   Number of children: Not on file   Years of education: Not on file   Highest education level: Not on file  Occupational History   Not on file  Tobacco Use   Smoking status: Never   Smokeless tobacco: Never  Vaping Use   Vaping Use: Never used  Substance and Sexual Activity   Alcohol use: Yes    Comment: rare  Drug use: No   Sexual activity: Not on file  Other Topics Concern   Not on file  Social History Narrative   Lives in [place] with wife. One daughter.      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  HENT:  Negative for congestion, sinus pressure and sore throat.   Eyes:  Negative for pain and visual disturbance.  Respiratory:  Negative for cough, chest tightness and shortness of breath.   Cardiovascular:  Negative for  chest pain, palpitations and leg swelling.  Gastrointestinal:  Negative for abdominal pain, diarrhea, nausea and vomiting.  Genitourinary:  Negative for difficulty urinating and dysuria.  Musculoskeletal:  Negative for joint swelling and myalgias.  Skin:  Negative for color change and rash.  Neurological:  Negative for dizziness, light-headedness and headaches.  Hematological:  Negative for adenopathy. Does not bruise/bleed easily.  Psychiatric/Behavioral:  Negative for agitation and dysphoric mood.       Objective:     BP 128/84    Pulse 68    Resp 18    Ht 5' 6.93" (1.7 m)    Wt 253 lb (114.8 kg)    SpO2 96%    BMI 39.71 kg/m  Wt Readings from Last 3 Encounters:  10/12/21 253 lb (114.8 kg)  09/10/20 268 lb (121.6 kg)  06/03/20 275 lb (124.7 kg)    Physical Exam Constitutional:      General: He is not in acute distress.    Appearance: Normal appearance. He is well-developed.  HENT:     Head: Normocephalic and atraumatic.     Right Ear: External ear normal.     Left Ear: External ear normal.  Eyes:     General: No scleral icterus.       Right eye: No discharge.        Left eye: No discharge.     Conjunctiva/sclera: Conjunctivae normal.  Neck:     Thyroid: No thyromegaly.  Cardiovascular:     Rate and Rhythm: Normal rate and regular rhythm.  Pulmonary:     Effort: No respiratory distress.     Breath sounds: Normal breath sounds. No wheezing.  Abdominal:     General: Bowel sounds are normal.     Palpations: Abdomen is soft.     Tenderness: There is no abdominal tenderness.  Genitourinary:    Comments: Per urology Musculoskeletal:        General: No swelling or tenderness.     Cervical back: Neck supple. No tenderness.  Lymphadenopathy:     Cervical: No cervical adenopathy.  Skin:    Findings: No erythema or rash.  Neurological:     Mental Status: He is alert and oriented to person, place, and time.  Psychiatric:        Mood and Affect: Mood normal.         Behavior: Behavior normal.     Outpatient Encounter Medications as of 10/12/2021  Medication Sig   acetaminophen (TYLENOL) 325 MG tablet Take by mouth.  allopurinol (ZYLOPRIM) 100 MG tablet Take 100 mg by mouth daily.   amLODipine (NORVASC) 5 MG tablet TAKE 1 TABLET(5 MG) BY MOUTH DAILY   busPIRone (BUSPAR) 5 MG tablet One q pm prn   cyclobenzaprine (FLEXERIL) 10 MG tablet TAKE 1 TABLET(10 MG) BY MOUTH DAILY AS NEEDED FOR MUSCLE SPASMS   famotidine (PEPCID) 40 MG tablet TAKE 1 TABLET(40 MG) BY MOUTH DAILY   finasteride (PROSCAR) 5 MG tablet    ibuprofen (ADVIL,MOTRIN) 200 MG tablet Take by mouth.   losartan-hydrochlorothiazide (HYZAAR) 100-25 MG tablet TAKE 1 TABLET BY MOUTH DAILY   MELATONIN PO Take by mouth.   Misc Natural Products (HERBAL ENERGY COMPLEX PO) Take by mouth.   Multiple Vitamin (MULTI-VITAMINS) TABS Take by mouth.   sildenafil (REVATIO) 20 MG tablet 3-5 tablets po daily as needed   valACYclovir (VALTREX) 500 MG tablet Take 1 tablet (500 mg total) by mouth daily as needed (As needed for cold sores).   No facility-administered encounter medications on file as of 10/12/2021.     Lab Results  Component Value Date   WBC 6.4 06/25/2017   HGB 14.6 06/25/2017   HCT 44.4 06/25/2017   PLT 226.0 06/25/2017   GLUCOSE 103 (H) 12/26/2017   CHOL 162 12/26/2017   TRIG 107.0 12/26/2017   HDL 39.90 12/26/2017   LDLDIRECT 123.7 10/07/2009   LDLCALC 101 (H) 12/26/2017   ALT 16 12/26/2017   AST 15 12/26/2017   NA 140 12/26/2017   K 3.9 12/26/2017   CL 104 12/26/2017   CREATININE 1.03 12/26/2017   BUN 26 (H) 12/26/2017   CO2 28 12/26/2017   TSH 2.00 06/25/2017   PSA 2.16 10/07/2009   HGBA1C 5.5 02/25/2015   MICROALBUR <0.7 02/25/2015       Assessment & Plan:   Problem List Items Addressed This Visit     Close exposure to COVID-19 virus    Wife has covid.  He is without symptoms.  Will monitor.  He has tested negative as outlined.  Notify us if develops symptoms.        Essential hypertension, benign    Blood pressure as outlined on amlodipine and losartan/hctz.  Follow pressures.  Follow metabolic panel.        Relevant Orders   Basic Metabolic Panel (BMET)   Health care maintenance    Physical today 10/12/21.  Colonoscopy 05/2015.  Had recommended f/u in 5 years.  Follow up colonoscopy 08/2020 - internal hemorrhoids and diverticulosis.  Recommended f/u in 5 years.  Planning to see Dr Bernardo Heater next month for prostate followup and will get PSA checked through them.        Hyperglycemia    Low carb diet and exercise.  Check a1c.        Relevant Orders   HgB A1c   Pure hypercholesterolemia    Have discussed calculated cholesterol risk and recommendation to start a cholesterol medication.  He has declined previously.   Low cholesterol diet and exercise.  Follow lipid panel.        Relevant Orders   CBC with Differential/Platelet   Lipid panel   TSH   Hepatic function panel   Vitamin D deficiency    Check vitamin D level today.        Relevant Orders   VITAMIN D 25 Hydroxy (Vit-D Deficiency, Fractures)   Other Visit Diagnoses     Routine adult health maintenance            Einar Pheasant, MD

## 2021-10-12 NOTE — Assessment & Plan Note (Signed)
Check vitamin D level today 

## 2021-10-12 NOTE — Assessment & Plan Note (Signed)
Have discussed calculated cholesterol risk and recommendation to start a cholesterol medication.  He has declined previously.   Low cholesterol diet and exercise.  Follow lipid panel.

## 2021-10-12 NOTE — Assessment & Plan Note (Signed)
Wife has covid.  He is without symptoms.  Will monitor.  He has tested negative as outlined.  Notify us if develops symptoms.

## 2021-10-18 ENCOUNTER — Telehealth: Payer: Self-pay

## 2021-10-18 NOTE — Telephone Encounter (Signed)
See result note.  

## 2021-10-18 NOTE — Telephone Encounter (Signed)
LMTCB regarding results.  

## 2021-10-18 NOTE — Telephone Encounter (Signed)
Patient returned office phone call for results 

## 2021-10-19 ENCOUNTER — Other Ambulatory Visit: Payer: Self-pay

## 2021-10-19 DIAGNOSIS — E7800 Pure hypercholesterolemia, unspecified: Secondary | ICD-10-CM

## 2021-10-19 MED ORDER — ROSUVASTATIN CALCIUM 10 MG PO TABS: 10.0000 mg | ORAL_TABLET | Freq: Every day | ORAL | 3 refills | Status: DC

## 2021-10-19 NOTE — Telephone Encounter (Signed)
Patient returned office phone call for lab results. Ok to leave message.

## 2021-10-26 ENCOUNTER — Other Ambulatory Visit: Payer: Self-pay

## 2021-10-26 ENCOUNTER — Ambulatory Visit (INDEPENDENT_AMBULATORY_CARE_PROVIDER_SITE_OTHER): Payer: Medicare Other | Admitting: Urology

## 2021-10-26 ENCOUNTER — Encounter: Payer: Self-pay | Admitting: Urology

## 2021-10-26 VITALS — BP 128/85 | HR 6 | Ht 68.0 in | Wt 240.0 lb

## 2021-10-26 DIAGNOSIS — N4232 Atypical small acinar proliferation of prostate: Secondary | ICD-10-CM | POA: Diagnosis not present

## 2021-10-26 DIAGNOSIS — Z87442 Personal history of urinary calculi: Secondary | ICD-10-CM | POA: Diagnosis not present

## 2021-10-26 DIAGNOSIS — Z87898 Personal history of other specified conditions: Secondary | ICD-10-CM | POA: Diagnosis not present

## 2021-10-26 DIAGNOSIS — R972 Elevated prostate specific antigen [PSA]: Secondary | ICD-10-CM | POA: Diagnosis not present

## 2021-10-26 DIAGNOSIS — N401 Enlarged prostate with lower urinary tract symptoms: Secondary | ICD-10-CM

## 2021-10-26 DIAGNOSIS — R3129 Other microscopic hematuria: Secondary | ICD-10-CM

## 2021-10-26 DIAGNOSIS — N5201 Erectile dysfunction due to arterial insufficiency: Secondary | ICD-10-CM | POA: Diagnosis not present

## 2021-10-26 LAB — URINALYSIS, COMPLETE
Bilirubin, UA: NEGATIVE
Glucose, UA: NEGATIVE
Ketones, UA: NEGATIVE
Leukocytes,UA: NEGATIVE
Nitrite, UA: NEGATIVE
Protein,UA: NEGATIVE
Specific Gravity, UA: >1.03 — ABNORMAL HIGH (ref 1.005–1.030)
Urobilinogen, Ur: 0.2 mg/dL (ref 0.2–1.0)
pH, UA: 5.5 (ref 5.0–7.5)

## 2021-10-26 LAB — MICROSCOPIC EXAMINATION
Bacteria, UA: NONE SEEN
Epithelial Cells (non renal): NONE SEEN /hpf (ref 0–10)

## 2021-10-26 MED ORDER — TADALAFIL 20 MG PO TABS: 20.0000 mg | ORAL_TABLET | Freq: Every day | ORAL | 2 refills | Status: DC | PRN

## 2021-10-26 MED ORDER — FINASTERIDE 5 MG PO TABS: 5.0000 mg | ORAL_TABLET | Freq: Every day | ORAL | 3 refills | Status: DC

## 2021-10-26 MED ORDER — AMLODIPINE BESYLATE 5 MG PO TABS: ORAL_TABLET | ORAL | 3 refills | Status: DC

## 2021-10-26 NOTE — Progress Notes (Signed)
10/26/2021 7:55 AM   Scott Crawford Apr 04, 1956 [id number]  Referring provider: Einar Pheasant, [address] [place],  [place] [address]  Chief Complaint  Patient presents with   Elevated PSA     HPI: Scott Crawford is a 66 y.o. male who presents to establish local urologic care.  Previously followed [facility] urology for BPH, history elevated PSA, erectile dysfunction and microscopic hematuria Prior history nephrolithiasis Last seen 09/09/2020 by Dr. Alto Denver Minimal microhematuria on recent UAs and he has declined further evaluation Prostate biopsy Dr. Cope-records not available but PSA was 4.7 and passed showed a focus of ASAP; no rebiopsy Started on finasteride around 2013 and corrected PSA has been within normal limits Last PSA 08/2020 was 1.74 (uncorrected) History of nephrolithiasis and started on allopurinol Using tadalafil for ED No bothersome LUTS Denies dysuria, gross hematuria No flank, abdominal or pelvic pain   PMH: Past Medical History:  Diagnosis Date   Anxiety    DDD (degenerative disc disease), lumbar    Elevated PSA    Followed by Dr. Jacqlyn Larsen   Epididymal cyst    H/O gastroesophageal reflux (GERD)    Herpes simplex    History of hiatal hernia    Hyperlipidemia    Hypertension    IBS (irritable bowel syndrome)    Liver abscess    Lumbar back pain    ruptured L5   Motion sickness    deep sea fishing   Obesity    Panic disorder without agoraphobia    Wears hearing aid in both ears     Surgical History: Past Surgical History:  Procedure Laterality Date   CATARACT EXTRACTION W/PHACO Right 09/10/2017   Procedure: CATARACT EXTRACTION PHACO AND INTRAOCULAR LENS PLACEMENT ([place])  RIGHT;  Surgeon: Leandrew Koyanagi, MD;  Location: [place];  Service: Ophthalmology;  Laterality: Right;   COLONOSCOPY     COLONOSCOPY WITH PROPOFOL N/A 06/21/2015   Procedure: COLONOSCOPY WITH PROPOFOL;  Surgeon: Hulen Luster, MD;   Location: [facility];  Service: Gastroenterology;  Laterality: N/A;   COLONOSCOPY WITH PROPOFOL N/A 09/10/2020   Procedure: COLONOSCOPY WITH PROPOFOL;  Surgeon: [name], MD;  Location: [place];  Service: Endoscopy;  Laterality: N/A;   ESOPHAGOGASTRODUODENOSCOPY     LIVER CYST REMOVAL     fusobacterium, 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 Jacqlyn Larsen, negative/inconclusive   [place]   child    Home Medications:  Allergies as of 10/26/2021       Reactions   Iodinated Contrast Media Anaphylaxis, Shortness Of Breath, Swelling   Ace Inhibitors    REACTION: Cough   Penicillins         Medication List        Accurate as of October 26, 2021  7:55 AM. If you have any questions, ask your nurse or doctor.          acetaminophen 325 MG tablet Commonly known as: TYLENOL Take by mouth.   allopurinol 100 MG tablet Commonly known as: ZYLOPRIM Take 100 mg by mouth daily.   allopurinol 100 MG tablet Commonly known as: ZYLOPRIM Take by mouth.   amLODipine 5 MG tablet Commonly known as: NORVASC TAKE 1 TABLET(5 MG) BY MOUTH DAILY   busPIRone 5 MG tablet Commonly known as: BUSPAR One q pm prn   cyclobenzaprine 10 MG tablet Commonly known as: FLEXERIL TAKE 1 TABLET(10 MG) BY MOUTH DAILY AS NEEDED FOR MUSCLE SPASMS   famotidine 40 MG  tablet Commonly known as: PEPCID TAKE 1 TABLET(40 MG) BY MOUTH DAILY   finasteride 5 MG tablet Commonly known as: PROSCAR   finasteride 5 MG tablet Commonly known as: PROSCAR Take 1 tablet by mouth daily.   HERBAL ENERGY COMPLEX PO Take by mouth.   ibuprofen 200 MG tablet Commonly known as: ADVIL Take by mouth.   losartan-hydrochlorothiazide 100-25 MG tablet Commonly known as: HYZAAR TAKE 1 TABLET BY MOUTH DAILY   MELATONIN PO Take by mouth.   Multi-Vitamins Tabs Take by mouth.   rosuvastatin 10 MG tablet Commonly known as: Crestor Take 1 tablet (10 mg total) by mouth daily.   sildenafil 20  MG tablet Commonly known as: REVATIO 3-5 tablets po daily as needed   tadalafil 20 MG tablet Commonly known as: CIALIS Take 20 mg by mouth daily as needed.   valACYclovir 500 MG tablet Commonly known as: VALTREX Take 1 tablet (500 mg total) by mouth daily as needed (As needed for cold sores).        Allergies:  Allergies  Allergen Reactions   Iodinated Contrast Media Anaphylaxis, Shortness Of Breath and Swelling   Ace Inhibitors     REACTION: Cough   Penicillins     Family History: Family History  Problem Relation Age of Onset   Cancer Mother        Thyroid cancer   Hyperlipidemia Father    Dementia Father    Cancer Maternal Grandmother        leukemia   Cancer Maternal Grandfather        colon, treated with surgical resection   Cancer Brother        colon    Social History:  reports that he has never smoked. He has never used smokeless tobacco. He reports current alcohol use. He reports that he does not use drugs.   Physical Exam: There were no vitals taken for this visit.  Constitutional:  Alert and oriented, No acute distress. HEENT: [place] AT, moist mucus membranes.  Trachea midline, no masses. Cardiovascular: No clubbing, cyanosis, or edema. Respiratory: Normal respiratory effort, no increased work of breathing. GI: Abdomen is soft, nontender, nondistended, no abdominal masses GU: Prostate 30 g, smooth without nodules.  Testes descended bilateral without masses.  The spermatocele present Skin: No rashes, bruises or suspicious lesions. Neurologic: Grossly intact, no focal deficits, moving all 4 extremities. Psychiatric: Normal mood and affect.   Assessment & Plan:    1.  BPH with LUTS Stable Finasteride refilled  2.  History elevated PSA Benign DRE PSA drawn today  3.  Erectile dysfunction Tadalafil refill  4.  History nephrolithiasis Asymptomatic Allopurinol refilled  5.  Microhematuria UA pending  Follow-up annually or as needed  Abbie Sons, MD  [place] [address], [place], [place] [address] [phone]

## 2021-10-27 ENCOUNTER — Encounter: Payer: Self-pay | Admitting: Urology

## 2021-10-27 ENCOUNTER — Other Ambulatory Visit: Payer: Self-pay | Admitting: *Deleted

## 2021-10-27 LAB — PSA: Prostate Specific Ag, Serum: 1.5 ng/mL (ref 0.0–4.0)

## 2021-10-27 MED ORDER — ALLOPURINOL 100 MG PO TABS: 100.0000 mg | ORAL_TABLET | Freq: Every day | ORAL | 3 refills | Status: DC

## 2021-11-24 ENCOUNTER — Other Ambulatory Visit: Payer: Self-pay

## 2021-11-24 ENCOUNTER — Other Ambulatory Visit (INDEPENDENT_AMBULATORY_CARE_PROVIDER_SITE_OTHER): Payer: Medicare Other

## 2021-11-24 DIAGNOSIS — E7800 Pure hypercholesterolemia, unspecified: Secondary | ICD-10-CM

## 2021-11-24 LAB — HEPATIC FUNCTION PANEL
ALT: 17 U/L (ref 0–53)
AST: 16 U/L (ref 0–37)
Albumin: 4.2 g/dL (ref 3.5–5.2)
Alkaline Phosphatase: 66 U/L (ref 39–117)
Bilirubin, Direct: 0.1 mg/dL (ref 0.0–0.3)
Total Bilirubin: 0.6 mg/dL (ref 0.2–1.2)
Total Protein: 6.7 g/dL (ref 6.0–8.3)

## 2021-12-08 ENCOUNTER — Other Ambulatory Visit: Payer: Self-pay | Admitting: Internal Medicine

## 2022-01-16 ENCOUNTER — Telehealth: Payer: Self-pay | Admitting: Internal Medicine

## 2022-01-16 NOTE — Telephone Encounter (Signed)
Spoke with patient he requested CB middle of May 2023 ?

## 2022-02-08 ENCOUNTER — Ambulatory Visit (INDEPENDENT_AMBULATORY_CARE_PROVIDER_SITE_OTHER): Payer: Medicare Other

## 2022-02-08 VITALS — Ht 68.0 in | Wt 240.0 lb

## 2022-02-08 DIAGNOSIS — Z0000 Encounter for general adult medical examination without abnormal findings: Secondary | ICD-10-CM

## 2022-02-08 NOTE — Progress Notes (Signed)
Subjective:   Scott Crawford is a 66 y.o. male who presents for an Initial Medicare Annual Wellness Visit.  Review of Systems    No ROS.  Medicare Wellness Virtual Visit.  Visual/audio telehealth visit, UTA vital signs.   See social history for additional risk factors.   Cardiac Risk Factors include: advanced age (>62men, >48 women);male gender     Objective:    Today's Vitals   02/08/22 1019  Weight: 240 lb (108.9 kg)  Height:  (1.727 m)   Body mass index is 36.49 kg/m.     02/08/2022   10:22 AM 09/10/2020   11:26 AM 09/10/2017   10:40 AM  Advanced Directives  Does Patient Have a Medical Advance Directive? Yes Yes Yes  Type of Estate agent of [place];Living will Healthcare Power of [place];Living will Living will;Healthcare Power of Attorney  Does patient want to make changes to medical advance directive? No - Patient declined No - Patient declined   Copy of Healthcare Power of Attorney in Chart? No - copy requested Yes - validated most recent copy scanned in chart (See row information) No - copy requested    Current Medications (verified) Outpatient Encounter Medications as of 02/08/2022  Medication Sig   acetaminophen (TYLENOL) 325 MG tablet Take by mouth.   allopurinol (ZYLOPRIM) 100 MG tablet Take by mouth.   allopurinol (ZYLOPRIM) 100 MG tablet Take 1 tablet (100 mg total) by mouth daily.   amLODipine (NORVASC) 5 MG tablet TAKE 1 TABLET(5 MG) BY MOUTH DAILY   busPIRone (BUSPAR) 5 MG tablet One q pm prn   cyclobenzaprine (FLEXERIL) 10 MG tablet TAKE 1 TABLET(10 MG) BY MOUTH DAILY AS NEEDED FOR MUSCLE SPASMS   famotidine (PEPCID) 40 MG tablet TAKE 1 TABLET(40 MG) BY MOUTH DAILY   finasteride (PROSCAR) 5 MG tablet    finasteride (PROSCAR) 5 MG tablet Take 1 tablet (5 mg total) by mouth daily.   ibuprofen (ADVIL,MOTRIN) 200 MG tablet Take by mouth.   losartan-hydrochlorothiazide (HYZAAR) 100-25 MG tablet TAKE 1 TABLET BY MOUTH DAILY    MELATONIN PO Take by mouth.   Misc Natural Products (HERBAL ENERGY COMPLEX PO) Take by mouth.   Multiple Vitamin (MULTI-VITAMINS) TABS Take by mouth.   rosuvastatin (CRESTOR) 10 MG tablet Take 1 tablet (10 mg total) by mouth daily.   sildenafil (REVATIO) 20 MG tablet 3-5 tablets po daily as needed   tadalafil (CIALIS) 20 MG tablet Take 1 tablet (20 mg total) by mouth daily as needed.   valACYclovir (VALTREX) 500 MG tablet Take 1 tablet (500 mg total) by mouth daily as needed (As needed for cold sores).   No facility-administered encounter medications on file as of 02/08/2022.    Allergies (verified) Iodinated contrast media, Ace inhibitors, and Penicillins   History: Past Medical History:  Diagnosis Date   Anxiety    DDD (degenerative disc disease), lumbar    Elevated PSA    Followed by Dr. Achilles Dunk   Epididymal cyst    H/O gastroesophageal reflux (GERD)    Herpes simplex    History of hiatal hernia    Hyperlipidemia    Hypertension    IBS (irritable bowel syndrome)    Liver abscess    Lumbar back pain    ruptured L5   Motion sickness    deep sea fishing   Obesity    Panic disorder without agoraphobia    Wears hearing aid in both ears    Past Surgical History:  Procedure  Laterality Date   CATARACT EXTRACTION W/PHACO Right 09/10/2017   Procedure: CATARACT EXTRACTION PHACO AND INTRAOCULAR LENS PLACEMENT ([facility])  RIGHT;  Surgeon: Lockie Mola, MD;  Location: [facility];  Service: Ophthalmology;  Laterality: Right;   COLONOSCOPY     COLONOSCOPY WITH PROPOFOL N/A 06/21/2015   Procedure: COLONOSCOPY WITH PROPOFOL;  Surgeon: Wallace Cullens, MD;  Location: [facility] ENDOSCOPY;  Service: Gastroenterology;  Laterality: N/A;   COLONOSCOPY WITH PROPOFOL N/A 09/10/2020   Procedure: COLONOSCOPY WITH PROPOFOL;  Surgeon: Midge Minium, MD;  Location: [facility];  Service: Endoscopy;  Laterality: N/A;   ESOPHAGOGASTRODUODENOSCOPY     LIVER CYST REMOVAL     fusobacterium, Dr.  Leavy Cella   PROSTATE BIOPSY  2013   Dr. Achilles Dunk, negative/inconclusive   TONSILLECTOMY  1962   child   Family History  Problem Relation Age of Onset   Cancer Mother        Thyroid cancer   Hyperlipidemia Father    Dementia Father    Cancer Maternal Grandmother        leukemia   Cancer Maternal Grandfather        colon, treated with surgical resection   Cancer Brother        colon   Social History   Socioeconomic History   Marital status: Married    Spouse name: Not on file   Number of children: Not on file   Years of education: Not on file   Highest education level: Not on file  Occupational History   Not on file  Tobacco Use   Smoking status: Never   Smokeless tobacco: Never  Vaping Use   Vaping Use: Never used  Substance and Sexual Activity   Alcohol use: Yes    Comment: rare   Drug use: No   Sexual activity: Not on file  Other Topics Concern   Not on file  Social History Narrative   Lives in [place] with wife. One daughter.      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 Worked for [employer]       Diet - regular      Exercise - limited   Social Determinants of Health   Financial Resource Strain: Low Risk    Difficulty of Paying Living Expenses: Not hard at all  Food Insecurity: No Food Insecurity   Worried About Programme researcher, broadcasting/film/video in the Last Year: Never true   Barista in the Last Year: Never true  Transportation Needs: No Transportation Needs   Lack of Transportation (Medical): No   Lack of Transportation (Non-Medical): No  Physical Activity: Not on file  Stress: No Stress Concern Present   Feeling of Stress : Not at all  Social Connections: Unknown   Frequency of Communication with Friends and Family: Not on file   Frequency of Social Gatherings with Friends and Family: Not on file   Attends Religious Services: Not on file   Active Member of Clubs or Organizations: Not on file   Attends Banker Meetings: Not on file    Marital Status: Married    Tobacco Counseling Counseling given: Not Answered   Clinical Intake:  Pre-visit preparation completed: Yes        Diabetes: No  How often do you need to have someone help you when you read instructions, pamphlets, or other written materials from your doctor or pharmacy?: 1 - Never  Interpreter Needed?: No      Activities of  Daily Living    02/08/2022   10:23 AM  In your present state of health, do you have any difficulty performing the following activities:  Hearing? 0  Vision? 0  Difficulty concentrating or making decisions? 0  Walking or climbing stairs? 0  Dressing or bathing? 0  Doing errands, shopping? 0  Preparing Food and eating ? N  Using the Toilet? N  In the past six months, have you accidently leaked urine? N  Do you have problems with loss of bowel control? N  Managing your Medications? N  Managing your Finances? N  Housekeeping or managing your Housekeeping? N   Patient Care Team: Dale Brentwood, MD as PCP - General (Internal Medicine)  Indicate any recent Medical Services you may have received from other than [facility] providers in the past year (date may be approximate).     Assessment:   This is a routine wellness examination for Scott Crawford.  Virtual Visit via Telephone Note  I connected with  Scott Crawford on 02/08/22 at 10:15 AM EDT by telephone and verified that I am speaking with the correct person using two identifiers.  Persons participating in the virtual visit: patient/Nurse Health Advisor   I discussed the limitations of performing an evaluation and management service by telehealth. We continued and completed visit with audio only. Some vital signs may be absent or patient reported.   Hearing/Vision screen Hearing Screening - Comments:: Hearing aids Vision Screening - Comments:: Followed by [facility] Cataract extraction, bilateral They have seen their ophthalmologist in the last 12 months.     Dietary issues and exercise activities discussed: Current Exercise Habits: Home exercise routine, Intensity: Mild   Goals Addressed             This Visit's Progress    Maintain healthy lifestyle       Stay active Healthy diet       Depression Screen    02/08/2022   10:23 AM 10/12/2021    7:38 AM 07/02/2019    9:07 AM 06/25/2017    8:26 AM 06/23/2016    9:18 AM  PHQ 2/9 Scores  PHQ - 2 Score 0 0 0 0 0    Fall Risk    02/08/2022   10:23 AM 10/12/2021    7:38 AM 07/02/2019    9:08 AM 06/25/2017    8:26 AM 06/23/2016    9:18 AM  Fall Risk   Falls in the past year? 0 0 0 No No  Number falls in past yr: 0 0     Injury with Fall?  0     Follow up Falls evaluation completed Falls evaluation completed Falls evaluation completed      FALL RISK PREVENTION PERTAINING TO THE HOME: Home free of loose throw rugs in walkways, pet beds, electrical cords, etc? Yes  Adequate lighting in your home to reduce risk of falls? Yes   ASSISTIVE DEVICES UTILIZED TO PREVENT FALLS: Life alert? No  Use of a cane, walker or w/c? No   TIMED UP AND GO: Was the test performed? No .    Cognitive Function:        Immunizations Immunization History  Administered Date(s) Administered   Fluad Quad(high Dose 65+) 06/04/2019, 06/03/2020   Influenza Inj Mdck Quad Pf 07/01/2018   Influenza Split 06/01/2014   Influenza Whole 07/26/2009, 06/13/2011, 06/06/2013, 06/07/2017   Influenza-Unspecified 05/29/2012, 06/04/2014, 05/27/2015   Moderna Covid-19 Vaccine Bivalent Booster 79yrs & up 06/21/2021   PFIZER(Purple Top)SARS-COV-2 Vaccination 10/30/2019,  11/20/2019, 07/19/2020   Pneumococcal Conjugate-13 07/28/2013   Pneumococcal Polysaccharide-23 12/12/2013   Tdap 12/12/2013, 03/15/2017    Shingrix Completed?: No.    Education has been provided regarding the importance of this vaccine. Patient has been advised to call insurance company to determine out of pocket expense if they have not yet  received this vaccine. Advised may also receive vaccine at local pharmacy or Health Dept. Verbalized acceptance and understanding.  Screening Tests Health Maintenance  Topic Date Due   Zoster Vaccines- Shingrix (1 of 2) 05/11/2022 (Originally 03/22/2004)   Pneumonia Vaccine 64+ Years old (3) 10/12/2022 (Originally 03/23/2019)   INFLUENZA VACCINE  04/25/2022   COLONOSCOPY (Pts 45-38yrs Insurance coverage will need to be confirmed)  09/10/2025   TETANUS/TDAP  03/16/2027   COVID-19 Vaccine  Completed   Hepatitis C Screening  Completed   HPV VACCINES  Aged Out   Health Maintenance There are no preventive care reminders to display for this patient.  Lung Cancer Screening: (Low Dose CT Chest recommended if Age 74-80 years, 30 pack-year currently smoking OR have quit w/in 15years.) does not qualify.   Vision Screening: Recommended annual ophthalmology exams for early detection of glaucoma and other disorders of the eye.  Dental Screening: Recommended annual dental exams for proper oral hygiene.  Community Resource Referral / Chronic Care Management: CRR required this visit?  No   CCM required this visit?  No      Plan:   Keep all routine maintenance appointments.   I have personally reviewed and noted the following in the patient's chart:   Medical and social history Use of alcohol, tobacco or illicit drugs  Current medications and supplements including opioid prescriptions. Patient is not currently taking opioid prescriptions. Functional ability and status Nutritional status Physical activity Advanced directives List of other physicians Hospitalizations, surgeries, and ER visits in previous 12 months Vitals Screenings to include cognitive, depression, and falls Referrals and appointments  In addition, I have reviewed and discussed with patient certain preventive protocols, quality metrics, and best practice recommendations. A written personalized care plan for preventive  services as well as general preventive health recommendations were provided to patient.     Ashok Pall, LPN   1/61/0960

## 2022-02-08 NOTE — Patient Instructions (Addendum)
?  Scott Crawford , ?Thank you for taking time to come for your Medicare Wellness Visit. I appreciate your ongoing commitment to your health goals. Please review the following plan we discussed and let me know if I can assist you in the future.  ? ?These are the goals we discussed: ? Goals   ? ?  Maintain healthy lifestyle   ?  Stay active ?Healthy diet ?  ? ?  ?  ?This is a list of the screening recommended for you and due dates:  ?Health Maintenance  ?Topic Date Due  ? Zoster (Shingles) Vaccine (1 of 2) 05/11/2022*  ? Pneumonia Vaccine (3) 10/12/2022*  ? Flu Shot  04/25/2022  ? Colon Cancer Screening  09/10/2025  ? Tetanus Vaccine  03/16/2027  ? COVID-19 Vaccine  Completed  ? Hepatitis C Screening: USPSTF Recommendation to screen - Ages 43-79 yo.  Completed  ? HPV Vaccine  Aged Out  ?*Topic was postponed. The date shown is not the original due date.  ?  ?

## 2022-03-19 ENCOUNTER — Other Ambulatory Visit: Payer: Self-pay | Admitting: Internal Medicine

## 2022-04-11 ENCOUNTER — Ambulatory Visit (INDEPENDENT_AMBULATORY_CARE_PROVIDER_SITE_OTHER): Payer: Medicare Other | Admitting: Internal Medicine

## 2022-04-11 ENCOUNTER — Encounter: Payer: Self-pay | Admitting: Internal Medicine

## 2022-04-11 VITALS — BP 118/76 | HR 76 | Temp 98.0°F | Resp 17 | Ht 68.0 in | Wt 267.2 lb

## 2022-04-11 DIAGNOSIS — F439 Reaction to severe stress, unspecified: Secondary | ICD-10-CM | POA: Diagnosis not present

## 2022-04-11 DIAGNOSIS — Z800 Family history of malignant neoplasm of digestive organs: Secondary | ICD-10-CM | POA: Diagnosis not present

## 2022-04-11 DIAGNOSIS — R739 Hyperglycemia, unspecified: Secondary | ICD-10-CM | POA: Diagnosis not present

## 2022-04-11 DIAGNOSIS — E7800 Pure hypercholesterolemia, unspecified: Secondary | ICD-10-CM | POA: Diagnosis not present

## 2022-04-11 DIAGNOSIS — K219 Gastro-esophageal reflux disease without esophagitis: Secondary | ICD-10-CM | POA: Diagnosis not present

## 2022-04-11 DIAGNOSIS — I10 Essential (primary) hypertension: Secondary | ICD-10-CM

## 2022-04-11 DIAGNOSIS — R3129 Other microscopic hematuria: Secondary | ICD-10-CM | POA: Diagnosis not present

## 2022-04-11 LAB — HEPATIC FUNCTION PANEL
ALT: 13 U/L (ref 0–53)
AST: 14 U/L (ref 0–37)
Albumin: 4.2 g/dL (ref 3.5–5.2)
Alkaline Phosphatase: 70 U/L (ref 39–117)
Bilirubin, Direct: 0.1 mg/dL (ref 0.0–0.3)
Total Bilirubin: 0.4 mg/dL (ref 0.2–1.2)
Total Protein: 6.7 g/dL (ref 6.0–8.3)

## 2022-04-11 LAB — LIPID PANEL
Cholesterol: 136 mg/dL (ref 0–200)
HDL: 49.2 mg/dL (ref >39.00)
LDL Cholesterol: 73 mg/dL (ref 0–99)
NonHDL: 87.17
Total CHOL/HDL Ratio: 3
Triglycerides: 70 mg/dL (ref 0.0–149.0)
VLDL: 14 mg/dL (ref 0.0–40.0)

## 2022-04-11 LAB — BASIC METABOLIC PANEL
BUN: 29 mg/dL — ABNORMAL HIGH (ref 6–23)
CO2: 28 mEq/L (ref 19–32)
Calcium: 9.6 mg/dL (ref 8.4–10.5)
Chloride: 105 mEq/L (ref 96–112)
Creatinine, Ser: 0.97 mg/dL (ref 0.40–1.50)
GFR: 80.47 mL/min (ref >60.00)
Glucose, Bld: 98 mg/dL (ref 70–99)
Potassium: 3.9 mEq/L (ref 3.5–5.1)
Sodium: 142 mEq/L (ref 135–145)

## 2022-04-11 LAB — HEMOGLOBIN A1C: Hgb A1c MFr Bld: 5.9 % (ref 4.6–6.5)

## 2022-04-11 MED ORDER — LOSARTAN POTASSIUM-HCTZ 100-25 MG PO TABS: 1.0000 | ORAL_TABLET | Freq: Every day | ORAL | 3 refills | Status: DC

## 2022-04-11 MED ORDER — ROSUVASTATIN CALCIUM 10 MG PO TABS: 10.0000 mg | ORAL_TABLET | Freq: Every day | ORAL | 3 refills | Status: DC

## 2022-04-11 NOTE — Progress Notes (Signed)
Patient ID: Izan Miron, male   DOB: 11-May-1956, 66 y.o.   MRN: [id number]   Subjective:    Patient ID: Abijah Roussel, male    DOB: [birth year], 66 y.o.   MRN: [id number]   Patient here for a scheduled follow up.   Chief Complaint  Patient presents with   Hypertension   Hyperlipidemia   .   HPI Doing relatively well.  Mother passed away in 03-07-23.  Has good support.  Feels handling things well.  Not exercising as much now.  Plans to start.  No chest pain or sob reported.  No abdominal pain or bowel change reported.  Taking CoQ10.  Tolerating statin.  Sees Dr Bernardo Heater - f/u psa.     Past Medical History:  Diagnosis Date   Anxiety    DDD (degenerative disc disease), lumbar    Elevated PSA    Followed by Dr. Jacqlyn Larsen   Epididymal cyst    H/O gastroesophageal reflux (GERD)    Herpes simplex    History of hiatal hernia    Hyperlipidemia    Hypertension    IBS (irritable bowel syndrome)    Liver abscess    Lumbar back pain    ruptured L5   Motion sickness    deep sea fishing   Obesity    Panic disorder without agoraphobia    Wears hearing aid in both ears    Past Surgical History:  Procedure Laterality Date   CATARACT EXTRACTION W/PHACO Right 09/10/2017   Procedure: CATARACT EXTRACTION PHACO AND INTRAOCULAR LENS PLACEMENT ([place])  RIGHT;  Surgeon: Leandrew Koyanagi, MD;  Location: [place];  Service: Ophthalmology;  Laterality: Right;   COLONOSCOPY     COLONOSCOPY WITH PROPOFOL N/A 06/21/2015   Procedure: COLONOSCOPY WITH PROPOFOL;  Surgeon: Hulen Luster, MD;  Location: [facility] ENDOSCOPY;  Service: Gastroenterology;  Laterality: N/A;   COLONOSCOPY WITH PROPOFOL N/A 09/10/2020   Procedure: COLONOSCOPY WITH PROPOFOL;  Surgeon: [name], MD;  Location: [place];  Service: Endoscopy;  Laterality: N/A;   ESOPHAGOGASTRODUODENOSCOPY     LIVER CYST REMOVAL     fusobacterium, 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 Jacqlyn Larsen, negative/inconclusive    [place]   child   Family History  Problem Relation Age of Onset   Cancer Mother        Thyroid cancer   Hyperlipidemia Father    Dementia Father    Cancer Maternal Grandmother        leukemia   Cancer Maternal Grandfather        colon, treated with surgical resection   Cancer Brother        colon   Social History   Socioeconomic History   Marital status: Married    Spouse name: Not on file   Number of children: Not on file   Years of education: Not on file   Highest education level: Not on file  Occupational History   Not on file  Tobacco Use   Smoking status: Never   Smokeless tobacco: Never  Vaping Use   Vaping Use: Never used  Substance and Sexual Activity   Alcohol use: Yes    Comment: rare   Drug use: No   Sexual activity: Not on file  Other Topics Concern   Not on file  Social History Narrative   Lives in [place] with wife. One daughter.      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  Worked for [employer] - regular      Exercise - limited   Social Determinants of Health   Financial Resource Strain: Low Risk  (02/08/2022)   Overall Financial Resource Strain (CARDIA)    Difficulty of Paying Living Expenses: Not hard at all  Food Insecurity: No Food Insecurity (02/08/2022)   Hunger Vital Sign    Worried About Running Out of Food in the Last Year: Never true    Ran Out of Food in the Last Year: Never true  Transportation Needs: No Transportation Needs (02/08/2022)   PRAPARE - Hydrologist (Medical): No    Lack of Transportation (Non-Medical): No  Physical Activity: Not on file  Stress: No Stress Concern Present (02/08/2022)   [place]    Feeling of Stress : Not at all  Social Connections: Unknown (02/08/2022)   Social Connection and Isolation Panel [NHANES]    Frequency of Communication with Friends and Family: Not on file     Frequency of Social Gatherings with Friends and Family: Not on file    Attends Religious Services: Not on file    Active Member of Clubs or Organizations: Not on file    Attends Archivist Meetings: Not on file    Marital Status: Married     Review of Systems  Constitutional:  Negative for appetite change and unexpected weight change.  HENT:  Negative for congestion and sinus pressure.   Respiratory:  Negative for cough, chest tightness and shortness of breath.   Cardiovascular:  Negative for chest pain, palpitations and leg swelling.  Gastrointestinal:  Negative for abdominal pain, diarrhea, nausea and vomiting.  Genitourinary:  Negative for difficulty urinating and dysuria.  Musculoskeletal:  Negative for joint swelling and myalgias.  Skin:  Negative for color change and rash.  Neurological:  Negative for dizziness, light-headedness and headaches.  Psychiatric/Behavioral:  Negative for agitation and dysphoric mood.        Objective:     BP 118/76   Pulse 76   Temp 98 F (36.7 C) (Temporal)   Resp 17   Ht '5\' 8"'$  (1.727 m)   Wt 267 lb 3.2 oz (121.2 kg)   SpO2 96%   BMI 40.63 kg/m  Wt Readings from Last 3 Encounters:  04/11/22 267 lb 3.2 oz (121.2 kg)  02/08/22 240 lb (108.9 kg)  10/26/21 240 lb (108.9 kg)    Physical Exam Constitutional:      General: He is not in acute distress.    Appearance: Normal appearance. He is well-developed.  HENT:     Head: Normocephalic and atraumatic.     Right Ear: External ear normal.     Left Ear: External ear normal.  Eyes:     General: No scleral icterus.       Right eye: No discharge.        Left eye: No discharge.  Cardiovascular:     Rate and Rhythm: Normal rate and regular rhythm.  Pulmonary:     Effort: Pulmonary effort is normal. No respiratory distress.     Breath sounds: Normal breath sounds.  Abdominal:     General: Bowel sounds are normal.     Palpations: Abdomen is soft.     Tenderness: There is no  abdominal tenderness.  Musculoskeletal:        General: No swelling or tenderness.     Cervical back: Neck supple.  No tenderness.  Lymphadenopathy:     Cervical: No cervical adenopathy.  Skin:    Findings: No erythema or rash.  Neurological:     Mental Status: He is alert.  Psychiatric:        Mood and Affect: Mood normal.        Behavior: Behavior normal.      Outpatient Encounter Medications as of 04/11/2022  Medication Sig   acetaminophen (TYLENOL) 325 MG tablet Take by mouth.   allopurinol (ZYLOPRIM) 100 MG tablet Take 1 tablet (100 mg total) by mouth daily.   amLODipine (NORVASC) 5 MG tablet TAKE 1 TABLET(5 MG) BY MOUTH DAILY   busPIRone (BUSPAR) 5 MG tablet One q pm prn   cyclobenzaprine (FLEXERIL) 10 MG tablet TAKE 1 TABLET(10 MG) BY MOUTH DAILY AS NEEDED FOR MUSCLE SPASMS   famotidine (PEPCID) 40 MG tablet TAKE 1 TABLET(40 MG) BY MOUTH DAILY   finasteride (PROSCAR) 5 MG tablet Take 1 tablet (5 mg total) by mouth daily.   ibuprofen (ADVIL,MOTRIN) 200 MG tablet Take by mouth.   MELATONIN PO Take by mouth.   Misc Natural Products (HERBAL ENERGY COMPLEX PO) Take by mouth.   Multiple Vitamin (MULTI-VITAMINS) TABS Take by mouth.   sildenafil (REVATIO) 20 MG tablet 3-5 tablets po daily as needed   tadalafil (CIALIS) 20 MG tablet Take 1 tablet (20 mg total) by mouth daily as needed.   valACYclovir (VALTREX) 500 MG tablet Take 1 tablet (500 mg total) by mouth daily as needed (As needed for cold sores).   [DISCONTINUED] allopurinol (ZYLOPRIM) 100 MG tablet Take by mouth.   [DISCONTINUED] finasteride (PROSCAR) 5 MG tablet    [DISCONTINUED] losartan-hydrochlorothiazide (HYZAAR) 100-25 MG tablet TAKE 1 TABLET BY MOUTH DAILY   [DISCONTINUED] rosuvastatin (CRESTOR) 10 MG tablet Take 1 tablet (10 mg total) by mouth daily.   losartan-hydrochlorothiazide (HYZAAR) 100-25 MG tablet Take 1 tablet by mouth daily.   rosuvastatin (CRESTOR) 10 MG tablet Take 1 tablet (10 mg total) by mouth daily.    No facility-administered encounter medications on file as of 04/11/2022.     Lab Results  Component Value Date   WBC 7.0 10/12/2021   HGB 14.6 10/12/2021   HCT 44.9 10/12/2021   PLT 231.0 10/12/2021   GLUCOSE 98 04/11/2022   CHOL 136 04/11/2022   TRIG 70.0 04/11/2022   HDL 49.20 04/11/2022   LDLDIRECT 123.7 10/07/2009   LDLCALC 73 04/11/2022   ALT 13 04/11/2022   AST 14 04/11/2022   NA 142 04/11/2022   K 3.9 04/11/2022   CL 105 04/11/2022   CREATININE 0.97 04/11/2022   BUN 29 (H) 04/11/2022   CO2 28 04/11/2022   TSH 2.34 10/12/2021   PSA 2.16 10/07/2009   HGBA1C 5.9 04/11/2022   MICROALBUR <0.7 02/25/2015       Assessment & Plan:   Problem List Items Addressed This Visit     Essential hypertension, benign    Blood pressure as outlined on amlodipine and losartan/hctz.  Follow pressures.  Follow metabolic panel.        Relevant Medications   losartan-hydrochlorothiazide (HYZAAR) 100-25 MG tablet   rosuvastatin (CRESTOR) 10 MG tablet   Other Relevant Orders   Basic Metabolic Panel (BMET) (Completed)   Family history of colon cancer    Colonoscopy 08/2020.  Recommended f/u in 5 years.       GERD (gastroesophageal reflux disease)    On pepcid now.  Feels overall acid reflux is controlled.  Will follow.  Notify me  if increased break through symptoms.       Hyperglycemia    Low carb diet and exercise.  Check a1c.        Relevant Orders   HgB A1c (Completed)   Microscopic hematuria    Urinalysis just checked - Dr Bernardo Heater.  Recommended no further w/up or evaluation.       Pure hypercholesterolemia - Primary    Tolerating crestor.  Continue.  Low cholesterol diet and exercise.  Follow lipid panel and liver function tests.       Relevant Medications   losartan-hydrochlorothiazide (HYZAAR) 100-25 MG tablet   rosuvastatin (CRESTOR) 10 MG tablet   Other Relevant Orders   Lipid Profile (Completed)   Hepatic function panel (Completed)   Stress    Increased  stress as outlined.  Has good support.  Does not feel needs any further intervention at this time.  Follow.         Einar Pheasant, MD

## 2022-04-11 NOTE — Assessment & Plan Note (Signed)
Blood pressure as outlined on amlodipine and losartan/hctz.  Follow pressures.  Follow metabolic panel.   

## 2022-04-12 ENCOUNTER — Encounter: Payer: Self-pay | Admitting: Internal Medicine

## 2022-04-12 NOTE — Assessment & Plan Note (Signed)
Urinalysis just checked - Dr Bernardo Heater.  Recommended no further w/up or evaluation.

## 2022-04-12 NOTE — Assessment & Plan Note (Signed)
Low carb diet and exercise.  Check a1c.

## 2022-04-12 NOTE — Assessment & Plan Note (Signed)
Colonoscopy 08/2020.  Recommended f/u in 5 years.  

## 2022-04-12 NOTE — Assessment & Plan Note (Addendum)
Tolerating crestor.  Continue.  Low cholesterol diet and exercise.  Follow lipid panel and liver function tests.

## 2022-04-12 NOTE — Assessment & Plan Note (Signed)
On pepcid now.  Feels overall acid reflux is controlled.  Will follow.  Notify me if increased break through symptoms.

## 2022-04-12 NOTE — Assessment & Plan Note (Signed)
Increased stress as outlined.  Has good support.  Does not feel needs any further intervention at this time.  Follow.

## 2022-04-18 DIAGNOSIS — L578 Other skin changes due to chronic exposure to nonionizing radiation: Secondary | ICD-10-CM | POA: Diagnosis not present

## 2022-04-18 DIAGNOSIS — Z859 Personal history of malignant neoplasm, unspecified: Secondary | ICD-10-CM | POA: Diagnosis not present

## 2022-04-18 DIAGNOSIS — Z872 Personal history of diseases of the skin and subcutaneous tissue: Secondary | ICD-10-CM | POA: Diagnosis not present

## 2022-04-18 DIAGNOSIS — L821 Other seborrheic keratosis: Secondary | ICD-10-CM | POA: Diagnosis not present

## 2022-04-18 DIAGNOSIS — L570 Actinic keratosis: Secondary | ICD-10-CM | POA: Diagnosis not present

## 2022-04-18 DIAGNOSIS — L918 Other hypertrophic disorders of the skin: Secondary | ICD-10-CM | POA: Diagnosis not present

## 2022-04-19 ENCOUNTER — Encounter: Payer: Self-pay | Admitting: Internal Medicine

## 2022-04-19 NOTE — Telephone Encounter (Signed)
See me before calling.  I know he was just here, but if he is agreeable, can schedule an appt Monday to discuss - would need to go through possible side effects, contraindications, options, etc.

## 2022-04-24 ENCOUNTER — Telehealth (INDEPENDENT_AMBULATORY_CARE_PROVIDER_SITE_OTHER): Payer: Medicare Other | Admitting: Internal Medicine

## 2022-04-24 ENCOUNTER — Encounter: Payer: Self-pay | Admitting: Internal Medicine

## 2022-04-24 DIAGNOSIS — Z713 Dietary counseling and surveillance: Secondary | ICD-10-CM | POA: Diagnosis not present

## 2022-04-24 DIAGNOSIS — R739 Hyperglycemia, unspecified: Secondary | ICD-10-CM | POA: Diagnosis not present

## 2022-04-24 DIAGNOSIS — I10 Essential (primary) hypertension: Secondary | ICD-10-CM | POA: Diagnosis not present

## 2022-04-24 DIAGNOSIS — E7800 Pure hypercholesterolemia, unspecified: Secondary | ICD-10-CM | POA: Diagnosis not present

## 2022-04-24 MED ORDER — METFORMIN HCL ER 500 MG PO TB24
500.0000 mg | ORAL_TABLET | Freq: Every day | ORAL | 2 refills | Status: DC
Start: 2022-04-24 — End: 2022-06-19

## 2022-04-24 NOTE — Progress Notes (Deleted)
Patient ID: Sevag Shearn, male   DOB: 1956/04/08, 66 y.o.   MRN: [id number]   Subjective:    Patient ID: Alexys Gassett, male    DOB: 1956-07-07, 66 y.o.   MRN: [id number]   Patient here for work in appt.   Chief Complaint  Patient presents with   Weight Loss    Discuss options   .   HPI Work in to discuss weight loss treatment options.  Discussed low carb diet and exercise.  Discussed medication options - including phentermine, metformin, GLP 1 agonist.  Mother had thyroid cancer.  Unsure of type.  She is deceased.  Discussed possible side effects and risk of medication.  In reviewing recent labs, a1c slightly elevated - 5.9.     Past Medical History:  Diagnosis Date   Anxiety    DDD (degenerative disc disease), lumbar    Elevated PSA    Followed by Dr. Jacqlyn Larsen   Epididymal cyst    H/O gastroesophageal reflux (GERD)    Herpes simplex    History of hiatal hernia    Hyperlipidemia    Hypertension    IBS (irritable bowel syndrome)    Liver abscess    Lumbar back pain    ruptured L5   Motion sickness    deep sea fishing   Obesity    Panic disorder without agoraphobia    Wears hearing aid in both ears    Past Surgical History:  Procedure Laterality Date   CATARACT EXTRACTION W/PHACO Right 09/10/2017   Procedure: CATARACT EXTRACTION PHACO AND INTRAOCULAR LENS PLACEMENT ([place])  RIGHT;  Surgeon: Leandrew Koyanagi, MD;  Location: [place];  Service: Ophthalmology;  Laterality: Right;   COLONOSCOPY     COLONOSCOPY WITH PROPOFOL N/A 06/21/2015   Procedure: COLONOSCOPY WITH PROPOFOL;  Surgeon: Hulen Luster, MD;  Location: [facility] ENDOSCOPY;  Service: Gastroenterology;  Laterality: N/A;   COLONOSCOPY WITH PROPOFOL N/A 09/10/2020   Procedure: COLONOSCOPY WITH PROPOFOL;  Surgeon: [name], MD;  Location: [place];  Service: Endoscopy;  Laterality: N/A;   ESOPHAGOGASTRODUODENOSCOPY     LIVER CYST REMOVAL     fusobacterium, Dr. Clayborn Bigness   PROSTATE BIOPSY   2013   Dr. Jacqlyn Larsen, negative/inconclusive   [place]   child   Family History  Problem Relation Age of Onset   Cancer Mother        Thyroid cancer   Hyperlipidemia Father    Dementia Father    Cancer Brother        colon   Cancer Maternal Grandmother        leukemia   Cancer Maternal Grandfather        colon, treated with surgical resection   Social History   Socioeconomic History   Marital status: Married    Spouse name: Not on file   Number of children: Not on file   Years of education: Not on file   Highest education level: Not on file  Occupational History   Not on file  Tobacco Use   Smoking status: Never   Smokeless tobacco: Never  Vaping Use   Vaping Use: Never used  Substance and Sexual Activity   Alcohol use: Yes    Comment: rare   Drug use: No   Sexual activity: Not on file  Other Topics Concern   Not on file  Social History Narrative   Lives in [place] with wife. One daughter.      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 [employer] - regular      Exercise - limited   Social Determinants of Health   Financial Resource Strain: Low Risk  (02/08/2022)   Overall Financial Resource Strain (CARDIA)    Difficulty of Paying Living Expenses: Not hard at all  Food Insecurity: No Food Insecurity (02/08/2022)   Hunger Vital Sign    Worried About Running Out of Food in the Last Year: Never true    Ran Out of Food in the Last Year: Never true  Transportation Needs: No Transportation Needs (02/08/2022)   PRAPARE - Hydrologist (Medical): No    Lack of Transportation (Non-Medical): No  Physical Activity: Not on file  Stress: No Stress Concern Present (02/08/2022)   [place]    Feeling of Stress : Not at all  Social Connections: Unknown (02/08/2022)   Social Connection and Isolation Panel [NHANES]    Frequency of Communication  with Friends and Family: Not on file    Frequency of Social Gatherings with Friends and Family: Not on file    Attends Religious Services: Not on file    Active Member of Clubs or Organizations: Not on file    Attends Archivist Meetings: Not on file    Marital Status: Married     Review of Systems  Constitutional:  Negative for appetite change and unexpected weight change.  HENT:  Negative for congestion and sinus pressure.   Respiratory:  Negative for cough, chest tightness and shortness of breath.   Cardiovascular:  Negative for chest pain, palpitations and leg swelling.  Gastrointestinal:  Negative for abdominal pain, diarrhea, nausea and vomiting.  Genitourinary:  Negative for difficulty urinating and dysuria.  Musculoskeletal:  Negative for joint swelling and myalgias.  Skin:  Negative for color change and rash.  Neurological:  Negative for dizziness, light-headedness and headaches.  Psychiatric/Behavioral:  Negative for agitation and dysphoric mood.        Objective:     Ht '5\' 8"'$  (1.727 m)   Wt 267 lb 3.2 oz (121.2 kg)   BMI 40.63 kg/m  Wt Readings from Last 3 Encounters:  04/24/22 267 lb 3.2 oz (121.2 kg)  04/11/22 267 lb 3.2 oz (121.2 kg)  02/08/22 240 lb (108.9 kg)    Physical Exam Constitutional:      General: He is not in acute distress.    Appearance: Normal appearance. He is well-developed.  HENT:     Head: Normocephalic and atraumatic.     Right Ear: External ear normal.     Left Ear: External ear normal.  Eyes:     General: No scleral icterus.       Right eye: No discharge.        Left eye: No discharge.  Cardiovascular:     Rate and Rhythm: Normal rate and regular rhythm.  Pulmonary:     Effort: Pulmonary effort is normal. No respiratory distress.     Breath sounds: Normal breath sounds.  Abdominal:     General: Bowel sounds are normal.     Palpations: Abdomen is soft.     Tenderness: There is no abdominal tenderness.   Musculoskeletal:        General: No swelling or tenderness.     Cervical back: Neck supple. No tenderness.  Lymphadenopathy:     Cervical: No cervical adenopathy.  Skin:    Findings:  No erythema or rash.  Neurological:     Mental Status: He is alert.  Psychiatric:        Mood and Affect: Mood normal.        Behavior: Behavior normal.      Outpatient Encounter Medications as of 04/24/2022  Medication Sig   acetaminophen (TYLENOL) 325 MG tablet Take by mouth.   allopurinol (ZYLOPRIM) 100 MG tablet Take 1 tablet (100 mg total) by mouth daily.   amLODipine (NORVASC) 5 MG tablet TAKE 1 TABLET(5 MG) BY MOUTH DAILY   busPIRone (BUSPAR) 5 MG tablet One q pm prn   cyclobenzaprine (FLEXERIL) 10 MG tablet TAKE 1 TABLET(10 MG) BY MOUTH DAILY AS NEEDED FOR MUSCLE SPASMS   famotidine (PEPCID) 40 MG tablet TAKE 1 TABLET(40 MG) BY MOUTH DAILY   finasteride (PROSCAR) 5 MG tablet Take 1 tablet (5 mg total) by mouth daily.   ibuprofen (ADVIL,MOTRIN) 200 MG tablet Take by mouth.   losartan-hydrochlorothiazide (HYZAAR) 100-25 MG tablet Take 1 tablet by mouth daily.   MELATONIN PO Take by mouth.   metFORMIN (GLUCOPHAGE-XR) 500 MG 24 hr tablet Take 1 tablet (500 mg total) by mouth daily with breakfast.   Misc Natural Products (HERBAL ENERGY COMPLEX PO) Take by mouth.   Multiple Vitamin (MULTI-VITAMINS) TABS Take by mouth.   rosuvastatin (CRESTOR) 10 MG tablet Take 1 tablet (10 mg total) by mouth daily.   sildenafil (REVATIO) 20 MG tablet 3-5 tablets po daily as needed   tadalafil (CIALIS) 20 MG tablet Take 1 tablet (20 mg total) by mouth daily as needed.   valACYclovir (VALTREX) 500 MG tablet Take 1 tablet (500 mg total) by mouth daily as needed (As needed for cold sores).   No facility-administered encounter medications on file as of 04/24/2022.     Lab Results  Component Value Date   WBC 7.0 10/12/2021   HGB 14.6 10/12/2021   HCT 44.9 10/12/2021   PLT 231.0 10/12/2021   GLUCOSE 98 04/11/2022    CHOL 136 04/11/2022   TRIG 70.0 04/11/2022   HDL 49.20 04/11/2022   LDLDIRECT 123.7 10/07/2009   LDLCALC 73 04/11/2022   ALT 13 04/11/2022   AST 14 04/11/2022   NA 142 04/11/2022   K 3.9 04/11/2022   CL 105 04/11/2022   CREATININE 0.97 04/11/2022   BUN 29 (H) 04/11/2022   CO2 28 04/11/2022   TSH 2.34 10/12/2021   PSA 2.16 10/07/2009   HGBA1C 5.9 04/11/2022   MICROALBUR <0.7 02/25/2015       Assessment & Plan:   Problem List Items Addressed This Visit     Essential hypertension, benign    Blood pressure as outlined on amlodipine and losartan/hctz.  Follow pressures.  Follow metabolic panel.        Hyperglycemia    Discussed low carb diet and exercise.  a1c slightly elevated - consistent with pre diabetes.  Start metformin.  Follow.       Pure hypercholesterolemia    Tolerating crestor.  Continue.  Low cholesterol diet and exercise.  Follow lipid panel and liver function tests.       Weight loss counseling, encounter for    Discussed low carb diet and exercise.  Discussed medication options.  Mother with thyroid cancer.  Unknown type.  Will see if can determine type of cancer.  Given recent elevated a1c (pre diabetes) - will start metformin.  May help with weight loss.  Follow.          Einar Pheasant,  MD

## 2022-04-30 ENCOUNTER — Encounter: Payer: Self-pay | Admitting: Internal Medicine

## 2022-04-30 DIAGNOSIS — Z713 Dietary counseling and surveillance: Secondary | ICD-10-CM | POA: Insufficient documentation

## 2022-04-30 NOTE — Assessment & Plan Note (Signed)
Tolerating crestor.  Continue.  Low cholesterol diet and exercise.  Follow lipid panel and liver function tests.

## 2022-04-30 NOTE — Assessment & Plan Note (Signed)
Discussed low carb diet and exercise.  Discussed medication options.  Mother with thyroid cancer.  Unknown type.  Will see if can determine type of cancer.  Given recent elevated a1c (pre diabetes) - will start metformin.  May help with weight loss.  Follow.

## 2022-04-30 NOTE — Assessment & Plan Note (Signed)
Blood pressure as outlined on amlodipine and losartan/hctz.  Follow pressures.  Follow metabolic panel.   

## 2022-04-30 NOTE — Assessment & Plan Note (Signed)
Discussed low carb diet and exercise.  a1c slightly elevated - consistent with pre diabetes.  Start metformin.  Follow.

## 2022-05-02 ENCOUNTER — Telehealth: Payer: Self-pay | Admitting: Internal Medicine

## 2022-05-02 NOTE — Telephone Encounter (Signed)
See attached message.  I had contacted Juliann Pulse to see how we could access Mr Noffke's mother's medical records.  Ms Arizola passed away.  Had a history of thyroid cancer and I needed to know type, because he was interested in starting GLP-1 for weight loss.  Please notify him of Kathy's response.  See attached. See me if questions.  Let us know if any problems.

## 2022-05-02 NOTE — Telephone Encounter (Signed)
-----   Message from Nanci Pina, RN sent at 05/01/2022 10:39 AM EDT ----- Regarding: RE: question Patient would need to contact medical records and make the request and would need a copy of death certificate as proof of death. ----- Message ----- From: Einar Pheasant, MD Sent: 04/30/2022   3:52 PM EDT To: Nanci Pina, RN Subject: question                                       I previously saw Mr Felter mother.  She had a history of thyroid cancer.  We are not sure what type of cancer.  She is now deceased.  Given he is interested in possible starting a GLP 1 receptor agonist, would need to know what type of cancer she had.  Is there anyway to access her record?  Thanks.   Dr Nicki Reaper

## 2022-05-03 NOTE — Telephone Encounter (Signed)
S/w Marlou Sa - advised same as below. Given medical records # [id number]

## 2022-05-07 NOTE — Progress Notes (Signed)
Patient ID: Scott Crawford, male   DOB: 1955/10/12, 66 y.o.   MRN: [id number]   Virtual Visit via video Note   All issues noted in this document were discussed and addressed.  No physical exam was performed (except for noted visual exam findings with Video Visits).   I connected with Scott Crawford by a video enabled telemedicine application and verified that I am speaking with the correct person using two identifiers. Location patient: home Location provider: work  Persons participating in the virtual visit: patient, provider  The limitations, risks, security and privacy concerns of performing an evaluation and management service by video and the availability of in person appointments have been discussed.  It has also been discussed with the patient that there may be a patient responsible charge related to this service. The patient expressed understanding and agreed to proceed.   Reason for visit: work in appt  HPI: Work in to discuss weight loss treatment options.  Discussed low carb diet and exercise.  Discussed medication options - including phentermine, metformin, GLP 1 agonist.  Mother had thyroid cancer.  Unsure of type.  She is deceased.  Discussed possible side effects and risk of medication.  In reviewing recent labs, a1c slightly elevated - 5.9.     ROS: See pertinent positives and negatives per HPI.  Past Medical History:  Diagnosis Date   Anxiety    DDD (degenerative disc disease), lumbar    Elevated PSA    Followed by Dr. Jacqlyn Crawford   Epididymal cyst    H/O gastroesophageal reflux (GERD)    Herpes simplex    History of hiatal hernia    Hyperlipidemia    Hypertension    IBS (irritable bowel syndrome)    Liver abscess    Lumbar back pain    ruptured L5   Motion sickness    deep sea fishing   Obesity    Panic disorder without agoraphobia    Wears hearing aid in both ears     Past Surgical History:  Procedure Laterality Date   CATARACT EXTRACTION W/PHACO Right  09/10/2017   Procedure: CATARACT EXTRACTION PHACO AND INTRAOCULAR LENS PLACEMENT ([place])  RIGHT;  Surgeon: Scott Koyanagi, MD;  Location: [place];  Service: Ophthalmology;  Laterality: Right;   COLONOSCOPY     COLONOSCOPY WITH PROPOFOL N/A 06/21/2015   Procedure: COLONOSCOPY WITH PROPOFOL;  Surgeon: Scott Luster, MD;  Location: [facility] ENDOSCOPY;  Service: Gastroenterology;  Laterality: N/A;   COLONOSCOPY WITH PROPOFOL N/A 09/10/2020   Procedure: COLONOSCOPY WITH PROPOFOL;  Surgeon: Scott Lame, MD;  Location: [place];  Service: Endoscopy;  Laterality: N/A;   ESOPHAGOGASTRODUODENOSCOPY     LIVER CYST REMOVAL     fusobacterium, Dr. Clayborn Crawford   PROSTATE BIOPSY  2013   Dr. Jacqlyn Crawford, negative/inconclusive   [place]   child    Family History  Problem Relation Age of Onset   Cancer Mother        Thyroid cancer   Hyperlipidemia Father    Dementia Father    Cancer Brother        colon   Cancer Maternal Grandmother        leukemia   Cancer Maternal Grandfather        colon, treated with surgical resection    SOCIAL HX: reviewed.    Current Outpatient Medications:    acetaminophen (TYLENOL) 325 MG tablet, Take by mouth., Disp: , Rfl:    allopurinol (ZYLOPRIM) 100 MG tablet, Take 1 tablet (  100 mg total) by mouth daily., Disp: 90 tablet, Rfl: 3   amLODipine (NORVASC) 5 MG tablet, TAKE 1 TABLET(5 MG) BY MOUTH DAILY, Disp: 90 tablet, Rfl: 3   busPIRone (BUSPAR) 5 MG tablet, One q pm prn, Disp: 90 tablet, Rfl: 0   cyclobenzaprine (FLEXERIL) 10 MG tablet, TAKE 1 TABLET(10 MG) BY MOUTH DAILY AS NEEDED FOR MUSCLE SPASMS, Disp: 90 tablet, Rfl: 0   famotidine (PEPCID) 40 MG tablet, TAKE 1 TABLET(40 MG) BY MOUTH DAILY, Disp: 90 tablet, Rfl: 1   finasteride (PROSCAR) 5 MG tablet, Take 1 tablet (5 mg total) by mouth daily., Disp: 90 tablet, Rfl: 3   ibuprofen (ADVIL,MOTRIN) 200 MG tablet, Take by mouth., Disp: , Rfl:    losartan-hydrochlorothiazide (HYZAAR) 100-25 MG  tablet, Take 1 tablet by mouth daily., Disp: 90 tablet, Rfl: 3   MELATONIN PO, Take by mouth., Disp: , Rfl:    metFORMIN (GLUCOPHAGE-XR) 500 MG 24 hr tablet, Take 1 tablet (500 mg total) by mouth daily with breakfast., Disp: 30 tablet, Rfl: 2   Misc Natural Products (HERBAL ENERGY COMPLEX PO), Take by mouth., Disp: , Rfl:    Multiple Vitamin (MULTI-VITAMINS) TABS, Take by mouth., Disp: , Rfl:    rosuvastatin (CRESTOR) 10 MG tablet, Take 1 tablet (10 mg total) by mouth daily., Disp: 90 tablet, Rfl: 3   sildenafil (REVATIO) 20 MG tablet, 3-5 tablets po daily as needed, Disp: , Rfl:    tadalafil (CIALIS) 20 MG tablet, Take 1 tablet (20 mg total) by mouth daily as needed., Disp: 60 tablet, Rfl: 2   valACYclovir (VALTREX) 500 MG tablet, Take 1 tablet (500 mg total) by mouth daily as needed (As needed for cold sores)., Disp: 90 tablet, Rfl: 3  EXAM:  GENERAL: alert, oriented, appears well and in no acute distress  HEENT: atraumatic, conjunttiva clear, no obvious abnormalities on inspection of external nose and ears  NECK: normal movements of the head and neck  LUNGS: on inspection no signs of respiratory distress, breathing rate appears normal, no obvious gross SOB, gasping or wheezing  CV: no obvious cyanosis  PSYCH/NEURO: pleasant and cooperative, no obvious depression or anxiety, speech and thought processing grossly intact  ASSESSMENT AND PLAN:  Discussed the following assessment and plan:  Problem List Items Addressed This Visit     Essential hypertension, benign    Blood pressure as outlined on amlodipine and losartan/hctz.  Follow pressures.  Follow metabolic panel.        Hyperglycemia    Discussed low carb diet and exercise.  a1c slightly elevated - consistent with pre diabetes.  Start metformin.  Follow.       Pure hypercholesterolemia    Tolerating crestor.  Continue.  Low cholesterol diet and exercise.  Follow lipid panel and liver function tests.       Weight loss  counseling, encounter for    Discussed low carb diet and exercise.  Discussed medication options.  Mother with thyroid cancer.  Unknown type.  Will see if can determine type of cancer.  Given recent elevated a1c (pre diabetes) - will start metformin.  May help with weight loss.  Follow.         Return if symptoms worsen or fail to improve.   I discussed the assessment and treatment plan with the patient. The patient was provided an opportunity to ask questions and all were answered. The patient agreed with the plan and demonstrated an understanding of the instructions.   The patient was advised to  call back or seek an in-person evaluation if the symptoms worsen or if the condition fails to improve as anticipated.    Einar Pheasant, MD

## 2022-06-07 DIAGNOSIS — L570 Actinic keratosis: Secondary | ICD-10-CM | POA: Diagnosis not present

## 2022-06-18 ENCOUNTER — Other Ambulatory Visit: Payer: Self-pay | Admitting: Internal Medicine

## 2022-06-20 ENCOUNTER — Other Ambulatory Visit: Payer: Self-pay | Admitting: Internal Medicine

## 2022-06-20 DIAGNOSIS — Z23 Encounter for immunization: Secondary | ICD-10-CM | POA: Diagnosis not present

## 2022-06-23 ENCOUNTER — Encounter: Payer: Self-pay | Admitting: Urology

## 2022-07-07 ENCOUNTER — Other Ambulatory Visit: Payer: Self-pay | Admitting: Internal Medicine

## 2022-09-11 ENCOUNTER — Ambulatory Visit: Payer: Medicare Other | Admitting: Family

## 2022-09-12 ENCOUNTER — Encounter: Payer: Self-pay | Admitting: Internal Medicine

## 2022-09-12 ENCOUNTER — Ambulatory Visit (INDEPENDENT_AMBULATORY_CARE_PROVIDER_SITE_OTHER): Payer: Medicare Other | Admitting: Internal Medicine

## 2022-09-12 VITALS — BP 128/80 | HR 96 | Temp 98.1°F | Resp 15 | Ht 68.0 in | Wt 265.0 lb

## 2022-09-12 DIAGNOSIS — K649 Unspecified hemorrhoids: Secondary | ICD-10-CM

## 2022-09-12 DIAGNOSIS — I10 Essential (primary) hypertension: Secondary | ICD-10-CM | POA: Diagnosis not present

## 2022-09-12 DIAGNOSIS — E7800 Pure hypercholesterolemia, unspecified: Secondary | ICD-10-CM

## 2022-09-12 NOTE — Progress Notes (Signed)
Patient ID: Scott Crawford, male   DOB: March 10, 1956, 66 y.o.   MRN: [id number]   Subjective:    Patient ID: Scott Crawford, male    DOB: [birth year], 66 y.o.   MRN: [id number]  Patient here for work in appt.  HPI Work in - evaluation of possible hemorrhoid. Has been present for 3-4 weeks.  States was doing a lot of lifting and pushing.  External - protrusion.  No bleeding.  Some itching.  Took ibuprofen.  Bowels moving.  No increased straining.  No abdominal pain.     Past Medical History:  Diagnosis Date   Anxiety    DDD (degenerative disc disease), lumbar    Elevated PSA    Followed by Dr. Jacqlyn Larsen   Epididymal cyst    H/O gastroesophageal reflux (GERD)    Herpes simplex    History of hiatal hernia    Hyperlipidemia    Hypertension    IBS (irritable bowel syndrome)    Liver abscess    Lumbar back pain    ruptured L5   Motion sickness    deep sea fishing   Obesity    Panic disorder without agoraphobia    Wears hearing aid in both ears    Past Surgical History:  Procedure Laterality Date   CATARACT EXTRACTION W/PHACO Right 09/10/2017   Procedure: CATARACT EXTRACTION PHACO AND INTRAOCULAR LENS PLACEMENT ([place])  RIGHT;  Surgeon: Leandrew Koyanagi, MD;  Location: [place];  Service: Ophthalmology;  Laterality: Right;   COLONOSCOPY     COLONOSCOPY WITH PROPOFOL N/A 06/21/2015   Procedure: COLONOSCOPY WITH PROPOFOL;  Surgeon: Hulen Luster, MD;  Location: [facility] ENDOSCOPY;  Service: Gastroenterology;  Laterality: N/A;   COLONOSCOPY WITH PROPOFOL N/A 09/10/2020   Procedure: COLONOSCOPY WITH PROPOFOL;  Surgeon: [name], MD;  Location: [place];  Service: Endoscopy;  Laterality: N/A;   ESOPHAGOGASTRODUODENOSCOPY     LIVER CYST REMOVAL     fusobacterium, 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 Jacqlyn Larsen, negative/inconclusive   [place]   child   Family History  Problem Relation Age of Onset   Cancer Mother        Thyroid cancer    Hyperlipidemia Father    Dementia Father    Cancer Brother        colon   Cancer Maternal Grandmother        leukemia   Cancer Maternal Grandfather        colon, treated with surgical resection   Social History   Socioeconomic History   Marital status: Married    Spouse name: Not on file   Number of children: Not on file   Years of education: Not on file   Highest education level: Not on file  Occupational History   Not on file  Tobacco Use   Smoking status: Never   Smokeless tobacco: Never  Vaping Use   Vaping Use: Never used  Substance and Sexual Activity   Alcohol use: Yes    Comment: rare   Drug use: No   Sexual activity: Not on file  Other Topics Concern   Not on file  Social History Narrative   Lives in [place] with wife. One daughter.      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 Worked for [employer]       Diet - regular      Exercise - limited   Social Determinants of Engineer, drilling  Resource Strain: Low Risk  (02/08/2022)   Overall Financial Resource Strain (CARDIA)    Difficulty of Paying Living Expenses: Not hard at all  Food Insecurity: No Food Insecurity (02/08/2022)   Hunger Vital Sign    Worried About Running Out of Food in the Last Year: Never true    Ran Out of Food in the Last Year: Never true  Transportation Needs: No Transportation Needs (02/08/2022)   PRAPARE - Hydrologist (Medical): No    Lack of Transportation (Non-Medical): No  Physical Activity: Not on file  Stress: No Stress Concern Present (02/08/2022)   [place]    Feeling of Stress : Not at all  Social Connections: Unknown (02/08/2022)   Social Connection and Isolation Panel [NHANES]    Frequency of Communication with Friends and Family: Not on file    Frequency of Social Gatherings with Friends and Family: Not on file    Attends Religious Services: Not on file    Active  Member of Clubs or Organizations: Not on file    Attends Archivist Meetings: Not on file    Marital Status: Married     Review of Systems  Constitutional:  Negative for appetite change and fever.  HENT:  Negative for congestion and sinus pressure.   Respiratory:  Negative for cough, chest tightness and shortness of breath.   Cardiovascular:  Negative for chest pain, palpitations and leg swelling.  Gastrointestinal:  Negative for abdominal pain, constipation, diarrhea, nausea and vomiting.  Genitourinary:  Negative for difficulty urinating and dysuria.  Musculoskeletal:  Negative for joint swelling and myalgias.  Skin:  Negative for color change and rash.  Neurological:  Negative for dizziness and headaches.  Psychiatric/Behavioral:  Negative for agitation and dysphoric mood.        Objective:     BP 128/80 (BP Location: Left Arm, Patient Position: Sitting, Cuff Size: Large)   Pulse 96   Temp 98.1 F (36.7 C) (Temporal)   Resp 15   Ht '5\' 8"'$  (1.727 m)   Wt 265 lb (120.2 kg)   SpO2 99%   BMI 40.29 kg/m  Wt Readings from Last 3 Encounters:  09/13/22 265 lb (120.2 kg)  09/12/22 265 lb (120.2 kg)  04/24/22 267 lb 3.2 oz (121.2 kg)    Physical Exam Vitals reviewed.  Constitutional:      General: He is not in acute distress.    Appearance: Normal appearance. He is well-developed.  HENT:     Head: Normocephalic and atraumatic.     Right Ear: External ear normal.     Left Ear: External ear normal.  Eyes:     General: No scleral icterus.       Right eye: No discharge.        Left eye: No discharge.     Conjunctiva/sclera: Conjunctivae normal.  Cardiovascular:     Rate and Rhythm: Normal rate and regular rhythm.  Pulmonary:     Effort: Pulmonary effort is normal. No respiratory distress.     Breath sounds: Normal breath sounds.  Abdominal:     General: Bowel sounds are normal.     Palpations: Abdomen is soft.     Tenderness: There is no abdominal  tenderness.  Genitourinary:    Comments: Rectal exam - external protrusion - rectal tissue - protrusion.  No bleeding.  Musculoskeletal:        General: No swelling or tenderness.  Cervical back: Neck supple. No tenderness.  Lymphadenopathy:     Cervical: No cervical adenopathy.  Skin:    Findings: No erythema or rash.  Neurological:     Mental Status: He is alert.  Psychiatric:        Mood and Affect: Mood normal.        Behavior: Behavior normal.      Outpatient Encounter Medications as of 09/12/2022  Medication Sig   allopurinol (ZYLOPRIM) 100 MG tablet Take 1 tablet (100 mg total) by mouth daily.   amLODipine (NORVASC) 5 MG tablet TAKE 1 TABLET(5 MG) BY MOUTH DAILY   busPIRone (BUSPAR) 5 MG tablet One q pm prn   cyclobenzaprine (FLEXERIL) 10 MG tablet TAKE 1 TABLET(10 MG) BY MOUTH DAILY AS NEEDED FOR MUSCLE SPASMS   famotidine (PEPCID) 40 MG tablet TAKE 1 TABLET(40 MG) BY MOUTH DAILY   finasteride (PROSCAR) 5 MG tablet Take 1 tablet (5 mg total) by mouth daily.   ibuprofen (ADVIL,MOTRIN) 200 MG tablet Take by mouth.   losartan-hydrochlorothiazide (HYZAAR) 100-25 MG tablet Take 1 tablet by mouth daily.   MELATONIN PO Take by mouth.   metFORMIN (GLUCOPHAGE-XR) 500 MG 24 hr tablet TAKE 1 TABLET(500 MG) BY MOUTH DAILY WITH BREAKFAST   Misc Natural Products (HERBAL ENERGY COMPLEX PO) Take by mouth.   Multiple Vitamin (MULTI-VITAMINS) TABS Take by mouth.   rosuvastatin (CRESTOR) 10 MG tablet Take 1 tablet (10 mg total) by mouth daily.   sildenafil (REVATIO) 20 MG tablet 3-5 tablets po daily as needed   tadalafil (CIALIS) 20 MG tablet Take 1 tablet (20 mg total) by mouth daily as needed.   valACYclovir (VALTREX) 500 MG tablet Take 1 tablet (500 mg total) by mouth daily as needed (As needed for cold sores).   [DISCONTINUED] acetaminophen (TYLENOL) 325 MG tablet Take by mouth.   No facility-administered encounter medications on file as of 09/12/2022.     Lab Results   Component Value Date   WBC 7.0 10/12/2021   HGB 14.6 10/12/2021   HCT 44.9 10/12/2021   PLT 231.0 10/12/2021   GLUCOSE 98 04/11/2022   CHOL 136 04/11/2022   TRIG 70.0 04/11/2022   HDL 49.20 04/11/2022   LDLDIRECT 123.7 10/07/2009   LDLCALC 73 04/11/2022   ALT 13 04/11/2022   AST 14 04/11/2022   NA 142 04/11/2022   K 3.9 04/11/2022   CL 105 04/11/2022   CREATININE 0.97 04/11/2022   BUN 29 (H) 04/11/2022   CO2 28 04/11/2022   TSH 2.34 10/12/2021   PSA 2.16 10/07/2009   HGBA1C 5.9 04/11/2022   MICROALBUR <0.7 02/25/2015       Assessment & Plan:  Hemorrhoids, unspecified hemorrhoid type Assessment & Plan: Was worked in with concern regarding possible hemorrhoid.  On exam - no bleeding.  No pain.  Appears to have protrusion - rectal tissue.  Bowels moving.  No straining and no increased pain with bowel movements.  Discussed surgery evaluation.  Agreeable.  Discussed with surgery.  Will work in for evaluation.   Orders: -     Ambulatory referral to General Surgery  Pure hypercholesterolemia Assessment & Plan: Continue crestor.    Essential hypertension, benign Assessment & Plan: Blood pressure as outlined on amlodipine and losartan/hctz.  Follow pressures.  Follow metabolic panel.        Einar Pheasant, MD

## 2022-09-13 ENCOUNTER — Telehealth: Payer: Self-pay | Admitting: Internal Medicine

## 2022-09-13 ENCOUNTER — Ambulatory Visit
Admission: EM | Admit: 2022-09-13 | Discharge: 2022-09-13 | Disposition: A | Discharge status: Home / Self Care | Payer: Medicare Other | Attending: Nurse Practitioner | Admitting: Nurse Practitioner

## 2022-09-13 DIAGNOSIS — N200 Calculus of kidney: Secondary | ICD-10-CM | POA: Insufficient documentation

## 2022-09-13 DIAGNOSIS — M545 Low back pain, unspecified: Secondary | ICD-10-CM | POA: Diagnosis not present

## 2022-09-13 LAB — URINALYSIS, MICROSCOPIC (REFLEX)
RBC / HPF: >50 RBC/hpf (ref 0–5)
Squamous Epithelial / HPF: NONE SEEN (ref 0–5)

## 2022-09-13 LAB — URINALYSIS, ROUTINE W REFLEX MICROSCOPIC
Bilirubin Urine: NEGATIVE
Glucose, UA: NEGATIVE mg/dL
Ketones, ur: NEGATIVE mg/dL
Leukocytes,Ua: NEGATIVE
Nitrite: NEGATIVE
Protein, ur: 30 mg/dL — AB
Specific Gravity, Urine: 1.02 (ref 1.005–1.030)
pH: 5.5 (ref 5.0–8.0)

## 2022-09-13 MED ORDER — TAMSULOSIN HCL 0.4 MG PO CAPS: 0.4000 mg | ORAL_CAPSULE | Freq: Every day | ORAL | 0 refills | Status: AC

## 2022-09-13 MED ORDER — HYDROCODONE-ACETAMINOPHEN 5-325 MG PO TABS: 1.0000 | ORAL_TABLET | Freq: Four times a day (QID) | ORAL | 0 refills | Status: DC | PRN

## 2022-09-13 NOTE — Telephone Encounter (Signed)
Please call and notify Scott Crawford that I did contact the surgeon (Dr Peyton Najjar) and he should be getting a call from them to work him in.  Let me know if he does not hear - regarding appt.

## 2022-09-13 NOTE — ED Triage Notes (Addendum)
Pt c/o left lower side back pain, started 3 hrs ago. Denies any trauma,urinary sx's or injuries to back, no radiation. Otc ibuprofen,muscle relaxer & oxycodone today w/o relief. Hx of kidney stones, & sciatica.

## 2022-09-13 NOTE — Telephone Encounter (Signed)
Pt advised.

## 2022-09-13 NOTE — Discharge Instructions (Addendum)
Start Flomax daily for 14 days Hydrocodone as needed for pain Continue ibuprofen as needed Heat to the low back as needed Rest Follow-up with your PCP 2 to 3 days for recheck Please go to the ER for any worsening symptoms

## 2022-09-13 NOTE — ED Provider Notes (Signed)
[facility]    CSN: [id number] Arrival date & time: 09/13/22  1332      History   Chief Complaint Chief Complaint  Patient presents with   Back Pain    Entered by patient    HPI Scott Crawford is a 66 y.o. male presents for evaluation of right lower back pain for 3 hours. Patient describes the pain as aching and rates the pain 6/10. Patient reports the pain is worse with movement and does not radiate. Patient has a history of back pain. Patient denies fever, chills, abdominal pain, flank pain, dysuria, urinary frequency/urgency, numbness/tingling/weakness of the lower extremities. No bowel and bladder incontinence. No saddle paraesthesia.  This began after he was working in his garage.  Denies any injury or known inciting event.  He took ibuprofen, a muscle relaxer, and oxycodone ( his last one) at home which brought his pain down to a 2 out of 10.  No hematuria.  He does have history of kidney stones.  Patient denies history of back injuries/surgeries in the past.  No other c/o today.    Back Pain   Past Medical History:  Diagnosis Date   Anxiety    DDD (degenerative disc disease), lumbar    Elevated PSA    Followed by Dr. Jacqlyn Larsen   Epididymal cyst    H/O gastroesophageal reflux (GERD)    Herpes simplex    History of hiatal hernia    Hyperlipidemia    Hypertension    IBS (irritable bowel syndrome)    Liver abscess    Lumbar back pain    ruptured L5   Motion sickness    deep sea fishing   Obesity    Panic disorder without agoraphobia    Wears hearing aid in both ears     Patient Active Problem List   Diagnosis Date Noted   Weight loss counseling, encounter for 04/30/2022   Hyperglycemia 10/12/2021   Close exposure to COVID-19 virus 10/12/2021   Encounter for screening colonoscopy    Family history of colon cancer    GERD (gastroesophageal reflux disease) 06/13/2020   Stress 07/28/2018   Right knee pain 12/26/2017   Incomplete emptying of  bladder 06/25/2017   Diverticulosis 06/24/2016   Benign localized hyperplasia of prostate with urinary obstruction 01/02/2016   Vitamin D deficiency 11/30/2015   BMI 39.0-39.9,adult 11/30/2015   Nephrolithiasis 05/19/2015   Essential hypertension, benign 02/25/2015   Health care maintenance 12/12/2013   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   Elevated prostate specific antigen (PSA) 06/19/2012   ED (erectile dysfunction) of organic origin 06/19/2012   Microscopic hematuria 06/19/2012   Spermatocele 06/19/2012   Pure hypercholesterolemia 12/07/2011   Panic disorder without agoraphobia 12/07/2011   Adjustment disorder with anxiety 05/27/2011    Past Surgical History:  Procedure Laterality Date   CATARACT EXTRACTION W/PHACO Right 09/10/2017   Procedure: CATARACT EXTRACTION PHACO AND INTRAOCULAR LENS PLACEMENT ([place])  RIGHT;  Surgeon: Leandrew Koyanagi, MD;  Location: [facility];  Service: Ophthalmology;  Laterality: Right;   COLONOSCOPY     COLONOSCOPY WITH PROPOFOL N/A 06/21/2015   Procedure: COLONOSCOPY WITH PROPOFOL;  Surgeon: Hulen Luster, MD;  Location: [facility];  Service: Gastroenterology;  Laterality: N/A;   COLONOSCOPY WITH PROPOFOL N/A 09/10/2020   Procedure: COLONOSCOPY WITH PROPOFOL;  Surgeon: [name], MD;  Location: [facility];  Service: Endoscopy;  Laterality: N/A;   ESOPHAGOGASTRODUODENOSCOPY     LIVER CYST REMOVAL     fusobacterium,  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 Jacqlyn Larsen, negative/inconclusive   [place]   child       Home Medications    Prior to Admission medications   Medication Sig Start Date End Date Taking? Authorizing Provider  allopurinol (ZYLOPRIM) 100 MG tablet Take 1 tablet (100 mg total) by mouth daily. 10/27/21  Yes Stoioff, Ronda Fairly, MD  amLODipine (NORVASC) 5 MG tablet TAKE 1 TABLET(5 MG) BY MOUTH DAILY 03/19/22  Yes Dutch Quint B, FNP  busPIRone (BUSPAR) 5 MG tablet One q pm prn  07/22/18  Yes Scott, Randell Patient, MD  cyclobenzaprine (FLEXERIL) 10 MG tablet TAKE 1 TABLET(10 MG) BY MOUTH DAILY AS NEEDED FOR MUSCLE SPASMS 07/01/20  Yes Einar Pheasant, MD  famotidine (PEPCID) 40 MG tablet TAKE 1 TABLET(40 MG) BY MOUTH DAILY 07/07/22  Yes Einar Pheasant, MD  finasteride (PROSCAR) 5 MG tablet Take 1 tablet (5 mg total) by mouth daily. 10/26/21  Yes Stoioff, Ronda Fairly, MD  HYDROcodone-acetaminophen (NORCO/VICODIN) 5-325 MG tablet Take 1 tablet by mouth every 6 (six) hours as needed for moderate pain. 09/13/22  Yes Melynda Ripple, NP  ibuprofen (ADVIL,MOTRIN) 200 MG tablet Take by mouth.   Yes [provider]  losartan-hydrochlorothiazide (HYZAAR) 100-25 MG tablet Take 1 tablet by mouth daily. 04/11/22  Yes Einar Pheasant, MD  MELATONIN PO Take by mouth.   Yes [provider]  metFORMIN (GLUCOPHAGE-XR) 500 MG 24 hr tablet TAKE 1 TABLET(500 MG) BY MOUTH DAILY WITH BREAKFAST 06/20/22  Yes Einar Pheasant, MD  Misc Natural Products (HERBAL ENERGY COMPLEX PO) Take by mouth.   Yes [provider]  Multiple Vitamin (MULTI-VITAMINS) TABS Take by mouth.   Yes [provider]  rosuvastatin (CRESTOR) 10 MG tablet Take 1 tablet (10 mg total) by mouth daily. 04/11/22  Yes Einar Pheasant, MD  sildenafil (REVATIO) 20 MG tablet 3-5 tablets po daily as needed 05/26/16  Yes [provider]  tadalafil (CIALIS) 20 MG tablet Take 1 tablet (20 mg total) by mouth daily as needed. 10/26/21  Yes Stoioff, Ronda Fairly, MD  tamsulosin (FLOMAX) 0.4 MG CAPS capsule Take 1 capsule (0.4 mg total) by mouth daily for 14 days. 09/13/22 09/27/22 Yes Melynda Ripple, NP  valACYclovir (VALTREX) 500 MG tablet Take 1 tablet (500 mg total) by mouth daily as needed (As needed for cold sores). 12/23/18  Yes Einar Pheasant, MD    Family History Family History  Problem Relation Age of Onset   Cancer Mother        Thyroid cancer   Hyperlipidemia Father    Dementia Father    Cancer Brother         colon   Cancer Maternal Grandmother        leukemia   Cancer Maternal Grandfather        colon, treated with surgical resection    Social History Social History   Tobacco Use   Smoking status: Never   Smokeless tobacco: Never  Vaping Use   Vaping Use: Never used  Substance Use Topics   Alcohol use: Yes    Comment: rare   Drug use: No     Allergies   Iodinated contrast media, Ace inhibitors, and Penicillins   Review of Systems Review of Systems  Musculoskeletal:  Positive for back pain.     Physical Exam Triage Vital Signs ED Triage Vitals  Enc Vitals Group     BP 09/13/22 1431 (!) 147/83  Pulse Rate 09/13/22 1431 81     Resp 09/13/22 1431 16     Temp 09/13/22 1431 98.3 F (36.8 C)     Temp Source 09/13/22 1431 Oral     SpO2 09/13/22 1431 96 %     Weight 09/13/22 1430 265 lb (120.2 kg)     Height 09/13/22 1430 '5\' 7"'$  (1.702 m)     Head Circumference --      Peak Flow --      Pain Score 09/13/22 1430 2     Pain Loc --      Pain Edu? --      Excl. in [place]? --    No data found.  Updated Vital Signs BP (!) 147/83 (BP Location: Left Arm)   Pulse 81   Temp 98.3 F (36.8 C) (Oral)   Resp 16   Ht '5\' 7"'$  (1.702 m)   Wt 265 lb (120.2 kg)   SpO2 96%   BMI 41.50 kg/m   Visual Acuity Right Eye Distance:   Left Eye Distance:   Bilateral Distance:    Right Eye Near:   Left Eye Near:    Bilateral Near:     Physical Exam Vitals and nursing note reviewed.  Constitutional:      Appearance: Normal appearance.  HENT:     Head: Normocephalic and atraumatic.  Eyes:     Pupils: Pupils are equal, round, and reactive to light.  Cardiovascular:     Rate and Rhythm: Normal rate.  Pulmonary:     Effort: Pulmonary effort is normal.  Musculoskeletal:     Cervical back: Normal.     Thoracic back: Normal.     Lumbar back: No swelling, edema, deformity, signs of trauma, spasms, tenderness or bony tenderness. Normal range of motion. Negative right straight leg  raise test and negative left straight leg raise test.       Back:  Skin:    General: Skin is warm and dry.  Neurological:     General: No focal deficit present.     Mental Status: He is alert and oriented to person, place, and time.     Deep Tendon Reflexes:     Reflex Scores:      Patellar reflexes are 2+ on the right side and 2+ on the left side. Psychiatric:        Mood and Affect: Mood normal.        Behavior: Behavior normal.      UC Treatments / Results  Labs (all labs ordered are listed, but only abnormal results are displayed) Labs Reviewed  URINALYSIS, ROUTINE W REFLEX MICROSCOPIC - Abnormal; Notable for the following components:      Result Value   APPearance HAZY (*)    Hgb urine dipstick LARGE (*)    Protein, ur 30 (*)    All other components within normal limits  URINALYSIS, MICROSCOPIC (REFLEX) - Abnormal; Notable for the following components:   Bacteria, UA RARE (*)    All other components within normal limits  URINE CULTURE    EKG   Radiology No results found.  Procedures Procedures (including critical care time)  Medications Ordered in UC Medications - No data to display  Initial Impression / Assessment and Plan / UC Course  I have reviewed the triage vital signs and the nursing notes.  Pertinent labs & imaging results that were available during my care of the patient were reviewed by me and considered in my medical decision making (see  chart for details).     Reviewed exam and symptoms with patient.  Patient reports he is now pain-free UA with large blood, gust kidney stone Start Flomax daily for 14 days Hydrocodone prn pain Urine culture He can continue ibuprofen as needed Heat to the back Rest PCP follow-up 2 to 3 days for recheck ER precautions reviewed and he verbalized understanding Final Clinical Impressions(s) / UC Diagnoses   Final diagnoses:  Acute right-sided low back pain without sciatica  Kidney stone     Discharge  Instructions      Start Flomax daily for 14 days Hydrocodone as needed for pain Continue ibuprofen as needed Heat to the low back as needed Rest Follow-up with your PCP 2 to 3 days for recheck Please go to the ER for any worsening symptoms     ED Prescriptions     Medication Sig Dispense Auth. Provider   tamsulosin (FLOMAX) 0.4 MG CAPS capsule Take 1 capsule (0.4 mg total) by mouth daily for 14 days. 14 capsule Melynda Ripple, NP   HYDROcodone-acetaminophen (NORCO/VICODIN) 5-325 MG tablet Take 1 tablet by mouth every 6 (six) hours as needed for moderate pain. 8 tablet Melynda Ripple, NP      I have reviewed the PDMP during this encounter.   Melynda Ripple, NP 09/13/22 1538

## 2022-09-15 ENCOUNTER — Encounter: Payer: Self-pay | Admitting: Internal Medicine

## 2022-09-18 ENCOUNTER — Encounter: Payer: Self-pay | Admitting: Urology

## 2022-09-18 ENCOUNTER — Encounter: Payer: Self-pay | Admitting: Internal Medicine

## 2022-09-19 ENCOUNTER — Other Ambulatory Visit: Payer: Self-pay | Admitting: Urology

## 2022-09-19 DIAGNOSIS — R109 Unspecified abdominal pain: Secondary | ICD-10-CM

## 2022-09-19 DIAGNOSIS — R10A2 Flank pain, left side: Secondary | ICD-10-CM

## 2022-09-19 DIAGNOSIS — R3129 Other microscopic hematuria: Secondary | ICD-10-CM

## 2022-09-21 ENCOUNTER — Encounter: Payer: Self-pay | Admitting: Internal Medicine

## 2022-09-21 ENCOUNTER — Ambulatory Visit
Admission: RE | Admit: 2022-09-21 | Discharge: 2022-09-21 | Disposition: A | Discharge status: Home / Self Care | Payer: Medicare Other | Source: Ambulatory Visit | Attending: Urology | Admitting: Urology

## 2022-09-21 DIAGNOSIS — K649 Unspecified hemorrhoids: Secondary | ICD-10-CM | POA: Insufficient documentation

## 2022-09-21 DIAGNOSIS — R3129 Other microscopic hematuria: Secondary | ICD-10-CM

## 2022-09-21 DIAGNOSIS — K5730 Diverticulosis of large intestine without perforation or abscess without bleeding: Secondary | ICD-10-CM | POA: Diagnosis not present

## 2022-09-21 DIAGNOSIS — N132 Hydronephrosis with renal and ureteral calculous obstruction: Secondary | ICD-10-CM | POA: Diagnosis not present

## 2022-09-21 DIAGNOSIS — K8689 Other specified diseases of pancreas: Secondary | ICD-10-CM | POA: Diagnosis not present

## 2022-09-21 DIAGNOSIS — R109 Unspecified abdominal pain: Secondary | ICD-10-CM | POA: Insufficient documentation

## 2022-09-21 DIAGNOSIS — R188 Other ascites: Secondary | ICD-10-CM | POA: Diagnosis not present

## 2022-09-21 NOTE — Assessment & Plan Note (Signed)
Was worked in with concern regarding possible hemorrhoid.  On exam - no bleeding.  No pain.  Appears to have protrusion - rectal tissue.  Bowels moving.  No straining and no increased pain with bowel movements.  Discussed surgery evaluation.  Agreeable.  Discussed with surgery.  Will work in for evaluation.

## 2022-09-21 NOTE — Assessment & Plan Note (Signed)
Continue crestor 

## 2022-09-21 NOTE — Assessment & Plan Note (Signed)
Blood pressure as outlined on amlodipine and losartan/hctz.  Follow pressures.  Follow metabolic panel.

## 2022-09-22 ENCOUNTER — Telehealth: Payer: Self-pay | Admitting: *Deleted

## 2022-09-22 NOTE — Telephone Encounter (Signed)
-----   Message from Abbie Sons, MD sent at 09/22/2022 10:35 AM EST ----- CT showed a 3 mm left distal ureteral calculus.  There is a 90% chance this stone will pass.  He is on tamsulosin already.  If he is having increased pain can schedule appointment next week to discuss removal options.  He also had benign renal cysts which do not require any monitoring

## 2022-09-22 NOTE — Telephone Encounter (Signed)
Called and reviewed CT scan with Scott Crawford.  Seeing Dr Bernardo Heater.  On tamsulosin.  No pain currently.  Also discussed his appt with Dr Peyton Najjar.  Appt scheduled for 09/28/22 10:00. Pt aware of appt time.  Having no rectal pain.  Bowels moving.

## 2022-09-22 NOTE — Telephone Encounter (Signed)
Notified patient as instructed, patient states his pain has decreased . He is going to let stone pass on its on. He will call back if he changes his mind.

## 2022-10-12 ENCOUNTER — Encounter: Payer: Self-pay | Admitting: Internal Medicine

## 2022-10-12 ENCOUNTER — Ambulatory Visit: Payer: Medicare PPO | Admitting: Internal Medicine

## 2022-10-12 VITALS — BP 124/80 | HR 91 | Temp 97.9°F | Resp 14 | Ht 67.0 in | Wt 263.2 lb

## 2022-10-12 DIAGNOSIS — E7800 Pure hypercholesterolemia, unspecified: Secondary | ICD-10-CM | POA: Diagnosis not present

## 2022-10-12 DIAGNOSIS — K649 Unspecified hemorrhoids: Secondary | ICD-10-CM

## 2022-10-12 DIAGNOSIS — R3129 Other microscopic hematuria: Secondary | ICD-10-CM

## 2022-10-12 DIAGNOSIS — I10 Essential (primary) hypertension: Secondary | ICD-10-CM | POA: Diagnosis not present

## 2022-10-12 DIAGNOSIS — F439 Reaction to severe stress, unspecified: Secondary | ICD-10-CM

## 2022-10-12 DIAGNOSIS — N200 Calculus of kidney: Secondary | ICD-10-CM

## 2022-10-12 DIAGNOSIS — Z800 Family history of malignant neoplasm of digestive organs: Secondary | ICD-10-CM

## 2022-10-12 DIAGNOSIS — R739 Hyperglycemia, unspecified: Secondary | ICD-10-CM

## 2022-10-12 DIAGNOSIS — Z125 Encounter for screening for malignant neoplasm of prostate: Secondary | ICD-10-CM

## 2022-10-12 DIAGNOSIS — Z0000 Encounter for general adult medical examination without abnormal findings: Secondary | ICD-10-CM

## 2022-10-12 MED ORDER — VALACYCLOVIR HCL 500 MG PO TABS: 500.0000 mg | ORAL_TABLET | Freq: Every day | ORAL | 3 refills | Status: DC | PRN

## 2022-10-12 NOTE — Assessment & Plan Note (Signed)
Physical today 10/12/22.  Colonoscopy 05/2015.  Had recommended f/u in 5 years.  Follow up colonoscopy 08/2020 - internal hemorrhoids and diverticulosis.  Recommended f/u in 5 years.  Planning to see Dr Bernardo Heater next month for prostate followup and will get PSA checked through them.

## 2022-10-12 NOTE — Progress Notes (Signed)
Subjective:    Patient ID: Scott Crawford, male    DOB: 12/02/1955, 67 y.o.   MRN: [id number]  Patient here for  Chief Complaint  Patient presents with   Annual Exam    CPE    HPI Here for a physical exam.  He reports he is doing relatively well.  Was sick last week with a probable viral respiratory infection.  No cough or congestion now.  No chest pain or sob.  No acid reflux.  No abdominal pain.  Bowels moving.  Saw Dr Peyton Najjar for hemorrhoid.  Elected observation.  No pain.  Passed kidney stone - New Years Eve.  Doing well.  No pain now.  No hematuria.    Past Medical History:  Diagnosis Date   Anxiety    DDD (degenerative disc disease), lumbar    Elevated PSA    Followed by Dr. Jacqlyn Larsen   Epididymal cyst    H/O gastroesophageal reflux (GERD)    Herpes simplex    History of hiatal hernia    Hyperlipidemia    Hypertension    IBS (irritable bowel syndrome)    Liver abscess    Lumbar back pain    ruptured L5   Motion sickness    deep sea fishing   Obesity    Panic disorder without agoraphobia    Wears hearing aid in both ears    Past Surgical History:  Procedure Laterality Date   CATARACT EXTRACTION W/PHACO Right 09/10/2017   Procedure: CATARACT EXTRACTION PHACO AND INTRAOCULAR LENS PLACEMENT ([place])  RIGHT;  Surgeon: Leandrew Koyanagi, MD;  Location: [place];  Service: Ophthalmology;  Laterality: Right;   COLONOSCOPY     COLONOSCOPY WITH PROPOFOL N/A 06/21/2015   Procedure: COLONOSCOPY WITH PROPOFOL;  Surgeon: Hulen Luster, MD;  Location: [facility] ENDOSCOPY;  Service: Gastroenterology;  Laterality: N/A;   COLONOSCOPY WITH PROPOFOL N/A 09/10/2020   Procedure: COLONOSCOPY WITH PROPOFOL;  Surgeon: [name], MD;  Location: [place];  Service: Endoscopy;  Laterality: N/A;   ESOPHAGOGASTRODUODENOSCOPY     LIVER CYST REMOVAL     fusobacterium, 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 Jacqlyn Larsen, negative/inconclusive   [place]   child    Family History  Problem Relation Age of Onset   Cancer Mother        Thyroid cancer   Hyperlipidemia Father    Dementia Father    Cancer Brother        colon   Cancer Maternal Grandmother        leukemia   Cancer Maternal Grandfather        colon, treated with surgical resection   Social History   Socioeconomic History   Marital status: Married    Spouse name: Not on file   Number of children: Not on file   Years of education: Not on file   Highest education level: Not on file  Occupational History   Not on file  Tobacco Use   Smoking status: Never   Smokeless tobacco: Never  Vaping Use   Vaping Use: Never used  Substance and Sexual Activity   Alcohol use: Yes    Comment: rare   Drug use: No   Sexual activity: Not on file  Other Topics Concern   Not on file  Social History Narrative   Lives in [place] with wife. One daughter.      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 Worked for [employer]  Diet - regular      Exercise - limited   Social Determinants of Health   Financial Resource Strain: Low Risk  (02/08/2022)   Overall Financial Resource Strain (CARDIA)    Difficulty of Paying Living Expenses: Not hard at all  Food Insecurity: No Food Insecurity (02/08/2022)   Hunger Vital Sign    Worried About Running Out of Food in the Last Year: Never true    Ran Out of Food in the Last Year: Never true  Transportation Needs: No Transportation Needs (02/08/2022)   PRAPARE - Hydrologist (Medical): No    Lack of Transportation (Non-Medical): No  Physical Activity: Not on file  Stress: No Stress Concern Present (02/08/2022)   [place]    Feeling of Stress : Not at all  Social Connections: Unknown (02/08/2022)   Social Connection and Isolation Panel [NHANES]    Frequency of Communication with Friends and Family: Not on file    Frequency of Social Gatherings  with Friends and Family: Not on file    Attends Religious Services: Not on file    Active Member of Clubs or Organizations: Not on file    Attends Archivist Meetings: Not on file    Marital Status: Married     Review of Systems  Constitutional:  Negative for appetite change and unexpected weight change.  HENT:  Negative for congestion, sinus pressure and sore throat.   Eyes:  Negative for pain and visual disturbance.  Respiratory:  Negative for cough, chest tightness and shortness of breath.   Cardiovascular:  Negative for chest pain, palpitations and leg swelling.  Gastrointestinal:  Negative for abdominal pain, diarrhea, nausea and vomiting.  Genitourinary:  Negative for difficulty urinating and dysuria.  Musculoskeletal:  Negative for myalgias.  Skin:  Negative for color change and rash.  Neurological:  Negative for dizziness and headaches.  Hematological:  Negative for adenopathy. Does not bruise/bleed easily.  Psychiatric/Behavioral:  Negative for agitation and dysphoric mood.        Objective:     BP 124/80   Pulse 91   Temp 97.9 F (36.6 C) (Oral)   Resp 14   Ht '5\' 7"'$  (1.702 m)   Wt 263 lb 3.2 oz (119.4 kg)   SpO2 96%   BMI 41.22 kg/m  Wt Readings from Last 3 Encounters:  10/12/22 263 lb 3.2 oz (119.4 kg)  09/13/22 265 lb (120.2 kg)  09/12/22 265 lb (120.2 kg)    Physical Exam Constitutional:      General: He is not in acute distress.    Appearance: Normal appearance. He is well-developed.  HENT:     Head: Normocephalic and atraumatic.     Right Ear: External ear normal.     Left Ear: External ear normal.  Eyes:     General: No scleral icterus.       Right eye: No discharge.        Left eye: No discharge.     Conjunctiva/sclera: Conjunctivae normal.  Neck:     Thyroid: No thyromegaly.  Cardiovascular:     Rate and Rhythm: Normal rate and regular rhythm.  Pulmonary:     Effort: No respiratory distress.     Breath sounds: Normal breath  sounds. No wheezing.  Abdominal:     General: Bowel sounds are normal.     Palpations: Abdomen is soft.     Tenderness: There is no abdominal tenderness.  Musculoskeletal:        General: No swelling or tenderness.     Cervical back: Neck supple. No tenderness.  Lymphadenopathy:     Cervical: No cervical adenopathy.  Skin:    Findings: No erythema or rash.  Neurological:     Mental Status: He is alert and oriented to person, place, and time.  Psychiatric:        Mood and Affect: Mood normal.        Behavior: Behavior normal.      Outpatient Encounter Medications as of 10/12/2022  Medication Sig   allopurinol (ZYLOPRIM) 100 MG tablet Take 1 tablet (100 mg total) by mouth daily.   amLODipine (NORVASC) 5 MG tablet TAKE 1 TABLET(5 MG) BY MOUTH DAILY   busPIRone (BUSPAR) 5 MG tablet One q pm prn   cyclobenzaprine (FLEXERIL) 10 MG tablet TAKE 1 TABLET(10 MG) BY MOUTH DAILY AS NEEDED FOR MUSCLE SPASMS   finasteride (PROSCAR) 5 MG tablet Take 1 tablet (5 mg total) by mouth daily.   HYDROcodone-acetaminophen (NORCO/VICODIN) 5-325 MG tablet Take 1 tablet by mouth every 6 (six) hours as needed for moderate pain.   ibuprofen (ADVIL,MOTRIN) 200 MG tablet Take by mouth.   losartan-hydrochlorothiazide (HYZAAR) 100-25 MG tablet Take 1 tablet by mouth daily.   MELATONIN PO Take by mouth.   metFORMIN (GLUCOPHAGE-XR) 500 MG 24 hr tablet TAKE 1 TABLET(500 MG) BY MOUTH DAILY WITH BREAKFAST   Misc Natural Products (HERBAL ENERGY COMPLEX PO) Take by mouth.   Multiple Vitamin (MULTI-VITAMINS) TABS Take by mouth.   rosuvastatin (CRESTOR) 10 MG tablet Take 1 tablet (10 mg total) by mouth daily.   tadalafil (CIALIS) 20 MG tablet Take 1 tablet (20 mg total) by mouth daily as needed.   valACYclovir (VALTREX) 500 MG tablet Take 1 tablet (500 mg total) by mouth daily as needed (As needed for cold sores).   [DISCONTINUED] famotidine (PEPCID) 40 MG tablet TAKE 1 TABLET(40 MG) BY MOUTH DAILY   [DISCONTINUED]  sildenafil (REVATIO) 20 MG tablet 3-5 tablets po daily as needed   [DISCONTINUED] valACYclovir (VALTREX) 500 MG tablet Take 1 tablet (500 mg total) by mouth daily as needed (As needed for cold sores).   No facility-administered encounter medications on file as of 10/12/2022.     Lab Results  Component Value Date   WBC 7.0 10/12/2021   HGB 14.6 10/12/2021   HCT 44.9 10/12/2021   PLT 231.0 10/12/2021   GLUCOSE 98 04/11/2022   CHOL 136 04/11/2022   TRIG 70.0 04/11/2022   HDL 49.20 04/11/2022   LDLDIRECT 123.7 10/07/2009   LDLCALC 73 04/11/2022   ALT 13 04/11/2022   AST 14 04/11/2022   NA 142 04/11/2022   K 3.9 04/11/2022   CL 105 04/11/2022   CREATININE 0.97 04/11/2022   BUN 29 (H) 04/11/2022   CO2 28 04/11/2022   TSH 2.34 10/12/2021   PSA 2.16 10/07/2009   HGBA1C 5.9 04/11/2022   MICROALBUR <0.7 02/25/2015    CT RENAL STONE STUDY  Result Date: 09/21/2022 CLINICAL DATA:  Left flank pain microscopic hematuria. History of renal stones. EXAM: CT ABDOMEN AND PELVIS WITHOUT CONTRAST TECHNIQUE: Multidetector CT imaging of the abdomen and pelvis was performed following the standard protocol without IV contrast. RADIATION DOSE REDUCTION: This exam was performed according to the departmental dose-optimization program which includes automated exposure control, adjustment of the mA and/or kV according to patient size and/or use of iterative reconstruction technique. COMPARISON:  CT November 18, 2010 FINDINGS: Lower chest: Bibasilar  atelectasis/scarring. Hepatobiliary: Unremarkable noncontrast enhanced appearance of the hepatic parenchyma. Gallbladder is unremarkable. No biliary ductal dilation. Pancreas: Fatty pancreatic atrophy. No pancreatic ductal dilation or evidence of acute inflammation. Spleen: No splenomegaly. Adrenals/Urinary Tract: Bilateral adrenal glands appear normal. Left hydroureteronephrosis with perinephric/periureteric stranding to the level of the ureterovesicular junction with  a 3 mm stone at the left UVJ. Hypodense right lower pole renal lesions measure up to 4.9 cm on image 43/2 and are at fluid density compatible with cysts and considered benign requiring no independent imaging follow-up. Urinary bladder is unremarkable for degree of distension. Stomach/Bowel: Stomach is nondistended limiting evaluation. No pathologic dilation of small or large bowel. The appendix and terminal ileum appear normal. Colonic diverticulosis without findings of acute diverticulitis. Vascular/Lymphatic: Aortic atherosclerosis. No pathologically enlarged abdominal or pelvic lymph nodes. Reproductive: Prostate is unremarkable. Other: No significant abdominopelvic free fluid. Musculoskeletal: No acute osseous abnormality. Multilevel degenerative change of the spine. Degenerative change of the bilateral hips. IMPRESSION: 1. Left hydroureteronephrosis with perinephric/periureteric stranding to the level of the ureterovesicular junction with a 3 mm stone at the left UVJ. 2. Colonic diverticulosis without findings of acute diverticulitis. 3.  Aortic Atherosclerosis (ICD10-I70.0). Electronically Signed   By: Dahlia Bailiff M.D.   On: 09/21/2022 13:07       Assessment & Plan:  Routine general medical examination at a health care facility  Pure hypercholesterolemia Assessment & Plan: Continue crestor.   Orders: -     Lipid panel; Future -     Hepatic function panel; Future -     TSH; Future  Essential hypertension, benign Assessment & Plan: Blood pressure as outlined on amlodipine and losartan/hctz.  Follow pressures.  Follow metabolic panel.    Orders: -     Basic metabolic panel; Future -     CBC with Differential/Platelet; Future  Hyperglycemia Assessment & Plan: Discussed low carb diet and exercise.  a1c slightly elevated - consistent with pre diabetes.  On metformin.  Follow met b and A1c.   Orders: -     Hemoglobin A1c; Future  Health care maintenance Assessment & Plan: Physical  today 10/12/22.  Colonoscopy 05/2015.  Had recommended f/u in 5 years.  Follow up colonoscopy 08/2020 - internal hemorrhoids and diverticulosis.  Recommended f/u in 5 years.  Planning to see Dr Bernardo Heater next month for prostate followup and will get PSA checked through them.     Prostate cancer screening  Family history of colon cancer Assessment & Plan: Colonoscopy 08/2020.  Recommended f/u in 5 years.    Hemorrhoids, unspecified hemorrhoid type Assessment & Plan: Saw Dr Peyton Najjar.  Elevated to follow.     Microscopic hematuria Assessment & Plan: Urinalysis checked - Dr Bernardo Heater.  Recommended no further w/up or evaluation. Recently evaluated - CT - passed kidney stone.  Continue f/u with urology.    Nephrolithiasis Assessment & Plan: Recently passed a stone. Followed by [facility].     Stress Assessment & Plan: Overall appears to be doing well.  Follow.    Other orders -     valACYclovir HCl; Take 1 tablet (500 mg total) by mouth daily as needed (As needed for cold sores).  Dispense: 90 tablet; Refill: 3     Einar Pheasant, MD

## 2022-10-15 ENCOUNTER — Encounter: Payer: Self-pay | Admitting: Internal Medicine

## 2022-10-15 ENCOUNTER — Other Ambulatory Visit: Payer: Self-pay | Admitting: Internal Medicine

## 2022-10-15 NOTE — Assessment & Plan Note (Signed)
Colonoscopy 08/2020.  Recommended f/u in 5 years.

## 2022-10-15 NOTE — Assessment & Plan Note (Signed)
Overall appears to be doing well.  Follow.  

## 2022-10-15 NOTE — Assessment & Plan Note (Signed)
Blood pressure as outlined on amlodipine and losartan/hctz.  Follow pressures.  Follow metabolic panel.

## 2022-10-15 NOTE — Assessment & Plan Note (Signed)
Continue crestor 

## 2022-10-15 NOTE — Assessment & Plan Note (Signed)
Recently passed a stone. Followed by [facility].

## 2022-10-15 NOTE — Assessment & Plan Note (Signed)
Saw Dr Peyton Najjar.  Elevated to follow.

## 2022-10-15 NOTE — Assessment & Plan Note (Addendum)
Urinalysis checked - Dr Bernardo Heater.  Recommended no further w/up or evaluation. Recently evaluated - CT - passed kidney stone.  Continue f/u with urology.

## 2022-10-15 NOTE — Assessment & Plan Note (Signed)
Discussed low carb diet and exercise.  a1c slightly elevated - consistent with pre diabetes.  On metformin.  Follow met b and A1c.

## 2022-10-26 ENCOUNTER — Ambulatory Visit: Payer: Medicare Other | Admitting: Urology

## 2022-10-26 ENCOUNTER — Other Ambulatory Visit (INDEPENDENT_AMBULATORY_CARE_PROVIDER_SITE_OTHER): Payer: Medicare PPO

## 2022-10-26 DIAGNOSIS — R739 Hyperglycemia, unspecified: Secondary | ICD-10-CM

## 2022-10-26 DIAGNOSIS — I10 Essential (primary) hypertension: Secondary | ICD-10-CM | POA: Diagnosis not present

## 2022-10-26 DIAGNOSIS — E7800 Pure hypercholesterolemia, unspecified: Secondary | ICD-10-CM | POA: Diagnosis not present

## 2022-10-26 LAB — HEPATIC FUNCTION PANEL
ALT: 27 U/L (ref 0–53)
AST: 20 U/L (ref 0–37)
Albumin: 3.8 g/dL (ref 3.5–5.2)
Alkaline Phosphatase: 70 U/L (ref 39–117)
Bilirubin, Direct: 0.1 mg/dL (ref 0.0–0.3)
Total Bilirubin: 0.4 mg/dL (ref 0.2–1.2)
Total Protein: 6.2 g/dL (ref 6.0–8.3)

## 2022-10-26 LAB — CBC WITH DIFFERENTIAL/PLATELET
Basophils Absolute: 0 10*3/uL (ref 0.0–0.1)
Basophils Relative: 0.5 % (ref 0.0–3.0)
Eosinophils Absolute: 0.4 10*3/uL (ref 0.0–0.7)
Eosinophils Relative: 4.2 % (ref 0.0–5.0)
HCT: 40.7 % (ref 39.0–52.0)
Hemoglobin: 13.8 g/dL (ref 13.0–17.0)
Lymphocytes Relative: 20.3 % (ref 12.0–46.0)
Lymphs Abs: 1.8 10*3/uL (ref 0.7–4.0)
MCHC: 34 g/dL (ref 30.0–36.0)
MCV: 84.2 fl (ref 78.0–100.0)
Monocytes Absolute: 0.8 10*3/uL (ref 0.1–1.0)
Monocytes Relative: 9.1 % (ref 3.0–12.0)
Neutro Abs: 5.7 10*3/uL (ref 1.4–7.7)
Neutrophils Relative %: 65.9 % (ref 43.0–77.0)
Platelets: 242 10*3/uL (ref 150.0–400.0)
RBC: 4.83 Mil/uL (ref 4.22–5.81)
RDW: 14.9 % (ref 11.5–15.5)
WBC: 8.7 10*3/uL (ref 4.0–10.5)

## 2022-10-26 LAB — BASIC METABOLIC PANEL
BUN: 23 mg/dL (ref 6–23)
CO2: 28 mEq/L (ref 19–32)
Calcium: 9.3 mg/dL (ref 8.4–10.5)
Chloride: 106 mEq/L (ref 96–112)
Creatinine, Ser: 0.94 mg/dL (ref 0.40–1.50)
GFR: 83.24 mL/min (ref >60.00)
Glucose, Bld: 110 mg/dL — ABNORMAL HIGH (ref 70–99)
Potassium: 3.9 mEq/L (ref 3.5–5.1)
Sodium: 142 mEq/L (ref 135–145)

## 2022-10-26 LAB — LIPID PANEL
Cholesterol: 137 mg/dL (ref 0–200)
HDL: 47.6 mg/dL (ref >39.00)
LDL Cholesterol: 66 mg/dL (ref 0–99)
NonHDL: 88.91
Total CHOL/HDL Ratio: 3
Triglycerides: 113 mg/dL (ref 0.0–149.0)
VLDL: 22.6 mg/dL (ref 0.0–40.0)

## 2022-10-26 LAB — TSH: TSH: 2.5 u[IU]/mL (ref 0.35–5.50)

## 2022-10-26 LAB — HEMOGLOBIN A1C: Hgb A1c MFr Bld: 6.1 % (ref 4.6–6.5)

## 2022-10-27 ENCOUNTER — Other Ambulatory Visit: Payer: Self-pay | Admitting: Internal Medicine

## 2022-10-30 ENCOUNTER — Ambulatory Visit: Payer: Medicare Other | Admitting: Urology

## 2022-11-15 ENCOUNTER — Ambulatory Visit: Payer: Medicare PPO | Admitting: Urology

## 2022-11-15 ENCOUNTER — Encounter: Payer: Self-pay | Admitting: Urology

## 2022-11-15 VITALS — BP 133/80 | HR 80 | Ht 67.0 in | Wt 255.0 lb

## 2022-11-15 DIAGNOSIS — N5201 Erectile dysfunction due to arterial insufficiency: Secondary | ICD-10-CM

## 2022-11-15 DIAGNOSIS — N401 Enlarged prostate with lower urinary tract symptoms: Secondary | ICD-10-CM

## 2022-11-15 DIAGNOSIS — R972 Elevated prostate specific antigen [PSA]: Secondary | ICD-10-CM | POA: Diagnosis not present

## 2022-11-15 DIAGNOSIS — Z87442 Personal history of urinary calculi: Secondary | ICD-10-CM | POA: Diagnosis not present

## 2022-11-15 DIAGNOSIS — Z87898 Personal history of other specified conditions: Secondary | ICD-10-CM

## 2022-11-15 MED ORDER — TADALAFIL 20 MG PO TABS: 20.0000 mg | ORAL_TABLET | Freq: Every day | ORAL | 2 refills | Status: DC | PRN

## 2022-11-15 MED ORDER — FINASTERIDE 5 MG PO TABS: 5.0000 mg | ORAL_TABLET | Freq: Every day | ORAL | 3 refills | Status: DC

## 2022-11-15 NOTE — Progress Notes (Signed)
11/15/2022 9:13 AM   Scott Crawford [birth year] [id number]  Referring provider: Einar Pheasant, [id number] [place],  [place] [address]  Chief Complaint  Patient presents with   Elevated PSA     HPI: 67 y.o. male presents for annual follow-up.  Previously followed [facility] urology for BPH, history elevated PSA, erectile dysfunction and microscopic hematuria Prior history nephrolithiasis Minimal microhematuria on recent UAs and he has declined further evaluation Prostate biopsy Dr. Cope-records not available but PSA was 4.7 and passed showed a focus of ASAP; no rebiopsy Started on finasteride around 2013 and corrected PSA has been within normal limits History of nephrolithiasis and started on allopurinol Using tadalafil for ED  Interval history: Since last years visit has noted some increased urinary urgency when changing positions sitting-standing Urgent care visit late December for flank pain and CT showed a 3 mm left distal ureteral calculus which she subsequently passed Currently asymptomatic Remains on finasteride and tadalafil   PMH: Past Medical History:  Diagnosis Date   Anxiety    DDD (degenerative disc disease), lumbar    Elevated PSA    Followed by Dr. Jacqlyn Larsen   Epididymal cyst    H/O gastroesophageal reflux (GERD)    Herpes simplex    History of hiatal hernia    Hyperlipidemia    Hypertension    IBS (irritable bowel syndrome)    Liver abscess    Lumbar back pain    ruptured L5   Motion sickness    deep sea fishing   Obesity    Panic disorder without agoraphobia    Wears hearing aid in both ears     Surgical History: Past Surgical History:  Procedure Laterality Date   CATARACT EXTRACTION W/PHACO Right 09/10/2017   Procedure: CATARACT EXTRACTION PHACO AND INTRAOCULAR LENS PLACEMENT ([place])  RIGHT;  Surgeon: Leandrew Koyanagi, MD;  Location: [place];  Service: Ophthalmology;  Laterality: Right;   COLONOSCOPY      COLONOSCOPY WITH PROPOFOL N/A 06/21/2015   Procedure: COLONOSCOPY WITH PROPOFOL;  Surgeon: Hulen Luster, MD;  Location: [facility] ENDOSCOPY;  Service: Gastroenterology;  Laterality: N/A;   COLONOSCOPY WITH PROPOFOL N/A 09/10/2020   Procedure: COLONOSCOPY WITH PROPOFOL;  Surgeon: [name], MD;  Location: [place];  Service: Endoscopy;  Laterality: N/A;   ESOPHAGOGASTRODUODENOSCOPY     LIVER CYST REMOVAL     fusobacterium, 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 Jacqlyn Larsen, negative/inconclusive   [place]   child    Home Medications:  Allergies as of 11/15/2022       Reactions   Iodinated Contrast Media Anaphylaxis, Shortness Of Breath, Swelling   Ace Inhibitors    REACTION: Cough   Penicillins         Medication List        Accurate as of November 15, 2022  9:13 AM. If you have any questions, ask your nurse or doctor.          STOP taking these medications    HYDROcodone-acetaminophen 5-325 MG tablet Commonly known as: NORCO/VICODIN Stopped by: Abbie Sons, MD       TAKE these medications    allopurinol 100 MG tablet Commonly known as: ZYLOPRIM Take 1 tablet (100 mg total) by mouth daily.   amLODipine 5 MG tablet Commonly known as: NORVASC TAKE 1 TABLET(5 MG) BY MOUTH DAILY   busPIRone 5 MG tablet Commonly known as: BUSPAR One q pm prn   cyclobenzaprine 10  MG tablet Commonly known as: FLEXERIL TAKE 1 TABLET(10 MG) BY MOUTH DAILY AS NEEDED FOR MUSCLE SPASMS   finasteride 5 MG tablet Commonly known as: PROSCAR Take 1 tablet (5 mg total) by mouth daily.   HERBAL ENERGY COMPLEX PO Take by mouth.   ibuprofen 200 MG tablet Commonly known as: ADVIL Take by mouth.   losartan-hydrochlorothiazide 100-25 MG tablet Commonly known as: HYZAAR Take 1 tablet by mouth daily.   MELATONIN PO Take by mouth.   metFORMIN 500 MG 24 hr tablet Commonly known as: GLUCOPHAGE-XR TAKE 1 TABLET(500 MG) BY MOUTH DAILY WITH BREAKFAST    Multi-Vitamins Tabs Take by mouth.   rosuvastatin 10 MG tablet Commonly known as: CRESTOR TAKE 1 TABLET(10 MG) BY MOUTH DAILY   tadalafil 20 MG tablet Commonly known as: CIALIS Take 1 tablet (20 mg total) by mouth daily as needed.   valACYclovir 500 MG tablet Commonly known as: VALTREX Take 1 tablet (500 mg total) by mouth daily as needed (As needed for cold sores).        Allergies:  Allergies  Allergen Reactions   Iodinated Contrast Media Anaphylaxis, Shortness Of Breath and Swelling   Ace Inhibitors     REACTION: Cough   Penicillins     Family History: Family History  Problem Relation Age of Onset   Cancer Mother        Thyroid cancer   Hyperlipidemia Father    Dementia Father    Cancer Brother        colon   Cancer Maternal Grandmother        leukemia   Cancer Maternal Grandfather        colon, treated with surgical resection    Social History:  reports that he has never smoked. He has never used smokeless tobacco. He reports current alcohol use. He reports that he does not use drugs.   Physical Exam: BP 133/80   Pulse 80   Ht 5' 7"$  (1.702 m)   Wt 255 lb (115.7 kg)   BMI 39.94 kg/m   Constitutional:  Alert and oriented, No acute distress. HEENT: [place] AT Respiratory: Normal respiratory effort, no increased work of breathing. GU: Prostate 30 g, smooth without nodules.  Testes descended bilateral without masses.  The spermatocele present Psychiatric: Normal mood and affect.   Assessment & Plan:    1.  BPH with LUTS Stable Finasteride refilled  2.  History elevated PSA Benign DRE PSA drawn today  3.  Erectile dysfunction Tadalafil refilled  4.  History nephrolithiasis Recently passed calculus.  He said the stone and will bring in for analysis   Continue annual follow-up   Abbie Sons, MD  [facility] [address] [place], [place] [address] [phone]

## 2022-11-16 ENCOUNTER — Telehealth: Payer: Self-pay

## 2022-11-16 LAB — PSA: Prostate Specific Ag, Serum: 1.4 ng/mL (ref 0.0–4.0)

## 2022-11-16 NOTE — Telephone Encounter (Signed)
Does not look like 1 was ordered.  If he wants to have checked can schedule lab visit with UA

## 2022-11-16 NOTE — Telephone Encounter (Signed)
Left message on voice mail . He can call back and be scheduled for a lab visit.

## 2022-11-16 NOTE — Telephone Encounter (Signed)
Patient states he can see his PSA level but does not see UA result. He did give a sample, I did not see an order for this but I just told patient I would check with the provider on this. Please let patient know.

## 2022-11-21 ENCOUNTER — Other Ambulatory Visit: Payer: Self-pay | Admitting: Family Medicine

## 2022-11-21 MED ORDER — ALLOPURINOL 100 MG PO TABS: 100.0000 mg | ORAL_TABLET | Freq: Every day | ORAL | 3 refills | Status: DC

## 2023-01-04 ENCOUNTER — Telehealth: Payer: Self-pay | Admitting: Internal Medicine

## 2023-01-08 NOTE — Telephone Encounter (Signed)
Med refilled.

## 2023-01-08 NOTE — Telephone Encounter (Signed)
Pt want all medication refilled sent to [facility] in [place]

## 2023-01-30 ENCOUNTER — Telehealth: Payer: Self-pay | Admitting: Internal Medicine

## 2023-01-30 NOTE — Telephone Encounter (Signed)
Contacted Scott Crawford to schedule their annual wellness visit. Patient declined to schedule AWV at this time. PATIENT STATED NOT INTERESTED   Scott Crawford; Care Guide Ambulatory Clinical Support [place] [facility] Direct Dial: [phone]

## 2023-02-04 ENCOUNTER — Other Ambulatory Visit: Payer: Self-pay | Admitting: Internal Medicine

## 2023-02-11 ENCOUNTER — Other Ambulatory Visit: Payer: Self-pay | Admitting: Internal Medicine

## 2023-03-14 DIAGNOSIS — H903 Sensorineural hearing loss, bilateral: Secondary | ICD-10-CM | POA: Diagnosis not present

## 2023-04-12 ENCOUNTER — Ambulatory Visit: Payer: Medicare PPO | Admitting: Internal Medicine

## 2023-05-02 DIAGNOSIS — L821 Other seborrheic keratosis: Secondary | ICD-10-CM | POA: Diagnosis not present

## 2023-05-02 DIAGNOSIS — L298 Other pruritus: Secondary | ICD-10-CM | POA: Diagnosis not present

## 2023-05-10 ENCOUNTER — Other Ambulatory Visit: Payer: Self-pay | Admitting: Internal Medicine

## 2023-05-15 DIAGNOSIS — H903 Sensorineural hearing loss, bilateral: Secondary | ICD-10-CM | POA: Diagnosis not present

## 2023-05-21 ENCOUNTER — Other Ambulatory Visit: Payer: Self-pay | Admitting: Internal Medicine

## 2023-07-05 ENCOUNTER — Other Ambulatory Visit: Payer: Self-pay | Admitting: Internal Medicine

## 2023-07-13 ENCOUNTER — Other Ambulatory Visit: Payer: Self-pay | Admitting: Internal Medicine

## 2023-07-18 ENCOUNTER — Encounter: Payer: Self-pay | Admitting: *Deleted

## 2023-07-18 ENCOUNTER — Other Ambulatory Visit: Payer: Self-pay

## 2023-07-18 ENCOUNTER — Telehealth: Payer: Self-pay

## 2023-07-18 NOTE — Telephone Encounter (Signed)
Pt schedule for 10/16/2023 medication sent in.

## 2023-07-18 NOTE — Telephone Encounter (Signed)
Prescription Request  07/18/2023  LOV: Visit date not found  What is the name of the medication or equipment? rosuvastatin (CRESTOR) 10 MG tablet  Have you contacted your pharmacy to request a refill? Yes   Which pharmacy would you like this sent to?  [facility] #[id number] [place], [place] - [address] AT [facility] [address] [place] [address] Phone: [phone] Fax: [phone]   Patient notified that their request is being sent to the clinical staff for review and that they should receive a response within 2 business days.   Please advise at Mobile [phone] (mobile)  Patient states his pharmacy has reached out to [place] a couple of times to request a refill but they have not heard back from [place].

## 2023-07-26 ENCOUNTER — Other Ambulatory Visit: Payer: Self-pay

## 2023-07-26 ENCOUNTER — Telehealth: Payer: Self-pay | Admitting: Internal Medicine

## 2023-07-26 MED ORDER — AMLODIPINE BESYLATE 5 MG PO TABS: ORAL_TABLET | ORAL | 1 refills | Status: DC

## 2023-07-26 NOTE — Telephone Encounter (Signed)
Patient just called and said he is out of one of his medication's. The name is amLODipine (NORVASC) 5 MG tablet. The pharmacy he use is [facility] #[id number] [place]ree D[place], [place] - [address] AT [facility] OF [address] [facility] [address] [place], [place] [address] Phone: [phone]  Fax: [phone]  His number is [phone]

## 2023-07-26 NOTE — Telephone Encounter (Signed)
Medication refilled. Patient is aware °

## 2023-09-04 DIAGNOSIS — H40023 Open angle with borderline findings, high risk, bilateral: Secondary | ICD-10-CM | POA: Diagnosis not present

## 2023-09-04 DIAGNOSIS — Z961 Presence of intraocular lens: Secondary | ICD-10-CM | POA: Diagnosis not present

## 2023-09-04 DIAGNOSIS — H35371 Puckering of macula, right eye: Secondary | ICD-10-CM | POA: Diagnosis not present

## 2023-09-04 DIAGNOSIS — E119 Type 2 diabetes mellitus without complications: Secondary | ICD-10-CM | POA: Diagnosis not present

## 2023-10-16 ENCOUNTER — Ambulatory Visit: Payer: Medicare PPO | Admitting: Internal Medicine

## 2023-10-24 DIAGNOSIS — L570 Actinic keratosis: Secondary | ICD-10-CM | POA: Diagnosis not present

## 2023-10-24 DIAGNOSIS — Z859 Personal history of malignant neoplasm, unspecified: Secondary | ICD-10-CM | POA: Diagnosis not present

## 2023-10-24 DIAGNOSIS — Z872 Personal history of diseases of the skin and subcutaneous tissue: Secondary | ICD-10-CM | POA: Diagnosis not present

## 2023-10-24 DIAGNOSIS — L578 Other skin changes due to chronic exposure to nonionizing radiation: Secondary | ICD-10-CM | POA: Diagnosis not present

## 2023-10-24 DIAGNOSIS — L821 Other seborrheic keratosis: Secondary | ICD-10-CM | POA: Diagnosis not present

## 2023-10-25 DIAGNOSIS — H40023 Open angle with borderline findings, high risk, bilateral: Secondary | ICD-10-CM | POA: Diagnosis not present

## 2023-10-25 DIAGNOSIS — E119 Type 2 diabetes mellitus without complications: Secondary | ICD-10-CM | POA: Diagnosis not present

## 2023-10-25 DIAGNOSIS — Z961 Presence of intraocular lens: Secondary | ICD-10-CM | POA: Diagnosis not present

## 2023-10-25 DIAGNOSIS — H35371 Puckering of macula, right eye: Secondary | ICD-10-CM | POA: Diagnosis not present

## 2023-11-12 ENCOUNTER — Other Ambulatory Visit: Payer: Self-pay | Admitting: Urology

## 2023-11-16 ENCOUNTER — Encounter: Payer: Self-pay | Admitting: Urology

## 2023-11-16 ENCOUNTER — Ambulatory Visit: Payer: Medicare PPO | Admitting: Urology

## 2023-11-16 VITALS — BP 158/77 | HR 84 | Ht 67.0 in | Wt 225.0 lb

## 2023-11-16 DIAGNOSIS — N401 Enlarged prostate with lower urinary tract symptoms: Secondary | ICD-10-CM

## 2023-11-16 DIAGNOSIS — N5201 Erectile dysfunction due to arterial insufficiency: Secondary | ICD-10-CM | POA: Diagnosis not present

## 2023-11-16 DIAGNOSIS — R3129 Other microscopic hematuria: Secondary | ICD-10-CM | POA: Diagnosis not present

## 2023-11-16 DIAGNOSIS — Z87898 Personal history of other specified conditions: Secondary | ICD-10-CM | POA: Diagnosis not present

## 2023-11-16 DIAGNOSIS — Z87442 Personal history of urinary calculi: Secondary | ICD-10-CM | POA: Diagnosis not present

## 2023-11-16 LAB — URINALYSIS, COMPLETE
Bilirubin, UA: NEGATIVE
Glucose, UA: NEGATIVE
Ketones, UA: NEGATIVE
Leukocytes,UA: NEGATIVE
Nitrite, UA: NEGATIVE
Protein,UA: NEGATIVE
Specific Gravity, UA: 1.025 (ref 1.005–1.030)
Urobilinogen, Ur: 0.2 mg/dL (ref 0.2–1.0)
pH, UA: 5.5 (ref 5.0–7.5)

## 2023-11-16 LAB — MICROSCOPIC EXAMINATION: Bacteria, UA: NONE SEEN

## 2023-11-16 MED ORDER — TADALAFIL 20 MG PO TABS: 20.0000 mg | ORAL_TABLET | Freq: Every day | ORAL | 3 refills | Status: AC | PRN

## 2023-11-16 MED ORDER — FINASTERIDE 5 MG PO TABS: 5.0000 mg | ORAL_TABLET | Freq: Every day | ORAL | 3 refills | Status: AC

## 2023-11-16 MED ORDER — ALLOPURINOL 100 MG PO TABS: 100.0000 mg | ORAL_TABLET | Freq: Every day | ORAL | 3 refills | Status: AC

## 2023-11-16 NOTE — Progress Notes (Signed)
 I, Scott Crawford, acting as a scribe for Scott Altes, MD., have documented all relevant documentation on the behalf of Scott Altes, MD, as directed by Scott Altes, MD while in the presence of Scott Altes, MD.  11/16/2023 9:28 AM   Murray Hodgkins Mar 07, 1956 [id number]  Referring provider: Dale Marineland, MD [address] [place],  [place] [address]  Chief Complaint  Patient presents with   Hematuria   Urologic history: 1.  BPH with LUTS Finasteride   2.  History elevated PSA   3.  Erectile dysfunction Tadalafil   4.  History nephrolithiasis Allopurinol   HPI: Scott Crawford is a 68 y.o. male presents for annual follow-up.   No significant problems since last year's visit.  Stable lower urinary tract symptoms on finasteride.  He may have passed a small stone since last year's visit.  Remains on finasteride for BPH, tadalafil for ED, and allopurinol for stone prevention.   PMH: Past Medical History:  Diagnosis Date   Anxiety    DDD (degenerative disc disease), lumbar    Elevated PSA    Followed by Dr. Achilles Dunk   Epididymal cyst    H/O gastroesophageal reflux (GERD)    Herpes simplex    History of hiatal hernia    Hyperlipidemia    Hypertension    IBS (irritable bowel syndrome)    Liver abscess    Lumbar back pain    ruptured L5   Motion sickness    deep sea fishing   Obesity    Panic disorder without agoraphobia    Wears hearing aid in both ears     Surgical History: Past Surgical History:  Procedure Laterality Date   CATARACT EXTRACTION W/PHACO Right 09/10/2017   Procedure: CATARACT EXTRACTION PHACO AND INTRAOCULAR LENS PLACEMENT (IOC)  RIGHT;  Surgeon: Lockie Mola, MD;  Location: [facility];  Service: Ophthalmology;  Laterality: Right;   COLONOSCOPY     COLONOSCOPY WITH PROPOFOL N/A 06/21/2015   Procedure: COLONOSCOPY WITH PROPOFOL;  Surgeon: Wallace Cullens, MD;  Location: [facility];   Service: Gastroenterology;  Laterality: N/A;   COLONOSCOPY WITH PROPOFOL N/A 09/10/2020   Procedure: COLONOSCOPY WITH PROPOFOL;  Surgeon: Midge Minium, MD;  Location: [facility];  Service: Endoscopy;  Laterality: N/A;   ESOPHAGOGASTRODUODENOSCOPY     LIVER CYST REMOVAL     fusobacterium, 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 Achilles Dunk, negative/inconclusive   TONSILLECTOMY  1962   child    Home Medications:  Allergies as of 11/16/2023       Reactions   Iodinated Contrast Media Anaphylaxis, Shortness Of Breath, Swelling   Ace Inhibitors    REACTION: Cough   Penicillins         Medication List        Accurate as of November 16, 2023  9:28 AM. If you have any questions, ask your nurse or doctor.          allopurinol 100 MG tablet Commonly known as: ZYLOPRIM Take 1 tablet (100 mg total) by mouth daily. What changed: See the new instructions. Changed by: Scott Crawford   amLODipine 5 MG tablet Commonly known as: NORVASC TAKE 1 TABLET(5 MG) BY MOUTH DAILY   busPIRone 5 MG tablet Commonly known as: BUSPAR One q pm prn   cyclobenzaprine 10 MG tablet Commonly known as: FLEXERIL TAKE 1 TABLET(10 MG) BY MOUTH DAILY AS NEEDED FOR MUSCLE SPASMS  famotidine 40 MG tablet Commonly known as: PEPCID TAKE 1 TABLET(40 MG) BY MOUTH DAILY   finasteride 5 MG tablet Commonly known as: PROSCAR Take 1 tablet (5 mg total) by mouth daily. What changed: See the new instructions. Changed by: Scott Crawford   HERBAL ENERGY COMPLEX PO Take by mouth.   ibuprofen 200 MG tablet Commonly known as: ADVIL Take by mouth.   losartan-hydrochlorothiazide 100-25 MG tablet Commonly known as: HYZAAR TAKE 1 TABLET BY MOUTH DAILY   MELATONIN PO Take by mouth.   metFORMIN 500 MG 24 hr tablet Commonly known as: GLUCOPHAGE-XR TAKE 1 TABLET(500 MG) BY MOUTH DAILY WITH BREAKFAST   Multi-Vitamins Tabs Take by mouth.   rosuvastatin 10 MG tablet Commonly known as: CRESTOR TAKE  1 TABLET(10 MG) BY MOUTH DAILY   tadalafil 20 MG tablet Commonly known as: CIALIS Take 1 tablet (20 mg total) by mouth daily as needed.   valACYclovir 500 MG tablet Commonly known as: VALTREX Take 1 tablet (500 mg total) by mouth daily as needed (As needed for cold sores).        Allergies:  Allergies  Allergen Reactions   Iodinated Contrast Media Anaphylaxis, Shortness Of Breath and Swelling   Ace Inhibitors     REACTION: Cough   Penicillins     Family History: Family History  Problem Relation Age of Onset   Cancer Mother        Thyroid cancer   Hyperlipidemia Father    Dementia Father    Cancer Brother        colon   Cancer Maternal Grandmother        leukemia   Cancer Maternal Grandfather        colon, treated with surgical resection    Social History:  reports that he has never smoked. He has never used smokeless tobacco. He reports current alcohol use. He reports that he does not use drugs.   Physical Exam: BP (!) 158/77   Pulse 84   Ht 5\' 7"  (1.702 m)   Wt 225 lb (102.1 kg) Comment: PER PATIENT  BMI 35.24 kg/m   Constitutional:  Alert and oriented, No acute distress. HEENT: [place] AT Respiratory: Normal respiratory effort, no increased work of breathing. Psychiatric: Normal mood and affect.   Urinalysis Dipstick 2+ blood/microscopic 3-10 RBC    Assessment & Plan:    1.  BPH with LUTS Stable Finasteride refilled  2. History elevated PSA PSA drawn today  3.  Erectile dysfunction Tadalafil refilled  4. History of nephrolithiasis Stable  5. Microhematuria Chronic low-volume microhematuria, and he has declined further evaluation.  I have reviewed the above documentation for accuracy and completeness, and I agree with the above.   Scott Altes, MD  [facility] [address] [place], [place] [address] [phone]

## 2023-11-17 LAB — PSA: Prostate Specific Ag, Serum: 1.2 ng/mL (ref 0.0–4.0)

## 2023-11-20 ENCOUNTER — Encounter: Payer: Self-pay | Admitting: Urology

## 2023-11-21 ENCOUNTER — Other Ambulatory Visit: Payer: Self-pay | Admitting: Internal Medicine

## 2023-11-26 ENCOUNTER — Ambulatory Visit
Admission: RE | Admit: 2023-11-26 | Discharge: 2023-11-26 | Disposition: A | Discharge status: Home / Self Care | Source: Ambulatory Visit | Attending: Emergency Medicine | Admitting: Emergency Medicine

## 2023-11-26 VITALS — BP 137/80 | HR 90 | Temp 98.5°F | Resp 16 | Ht 67.0 in | Wt 235.0 lb

## 2023-11-26 DIAGNOSIS — J069 Acute upper respiratory infection, unspecified: Secondary | ICD-10-CM

## 2023-11-26 MED ORDER — DOXYCYCLINE HYCLATE 100 MG PO CAPS: 100.0000 mg | ORAL_CAPSULE | Freq: Two times a day (BID) | ORAL | 0 refills | Status: AC

## 2023-11-26 MED ORDER — METHYLPREDNISOLONE 4 MG PO TBPK: ORAL_TABLET | ORAL | 0 refills | Status: DC

## 2023-11-26 NOTE — Discharge Instructions (Addendum)
 The Doxycycline twice daily with food for 7 days for treatment of your sinusitis.  Perform sinus irrigation 2-3 times a day with a NeilMed sinus rinse kit and distilled water.  Do not use tap water.  You can use plain over-the-counter Mucinex every 6 hours to break up the stickiness of the mucus so your body can clear it.  Increase your oral fluid intake to thin out your mucus so that is also able for your body to clear more easily.  Take an over-the-counter probiotic, such as Culturelle-align-activia, 1 hour after each dose of antibiotic to prevent diarrhea.  Start taking the Medrol Dosepak when you pick it up from the pharmacy.  Typically you take 2 tablets at breakfast time and 1 tablet at lunch.  I would take the first 3 tablets at lunch and then follow the dosing schedule.  You also the option of starting the Medrol Dosepak tomorrow morning at breakfast time.  If your symptoms do not improve, or they worsen, you can either return for reevaluation, or more appropriately you should follow-up with ENT.Marland Kitchen

## 2023-11-26 NOTE — ED Triage Notes (Signed)
 Pt c/o bilateral ear fullness & sinus pressure x3 wks. Has tried OTC meds w/o relief.

## 2023-11-26 NOTE — ED Provider Notes (Signed)
 [facility]    CSN: [id number] Arrival date & time: 11/26/23  0915      History   Chief Complaint Chief Complaint  Patient presents with   Sinus Problem    Appt   Ear Fullness    HPI Scott Crawford is a 68 y.o. male.   HPI  68 year old male with past medical history significant for essential hypertension, elevated PSA, hyperglycemia, prostate cancer, ED, high cholesterol, GERD, and diverticulosis presents for evaluation of upper respiratory symptoms that been going on for the last 2 weeks which include bilateral ear fullness as well as sinus pressure.  He reports that he has been performing sinus irrigation and nasal lavage twice daily.  He also reports that 2 [place] not equalize and he has had continuous pressure with decreased hearing ever since.  He denies any fever, or pain in his upper teeth.  No lower respiratory symptoms vocalized.  Past Medical History:  Diagnosis Date   Anxiety    DDD (degenerative disc disease), lumbar    Elevated PSA    Followed by Dr. Achilles Dunk   Epididymal cyst    H/O gastroesophageal reflux (GERD)    Herpes simplex    History of hiatal hernia    Hyperlipidemia    Hypertension    IBS (irritable bowel syndrome)    Liver abscess    Lumbar back pain    ruptured L5   Motion sickness    deep sea fishing   Obesity    Panic disorder without agoraphobia    Wears hearing aid in both ears     Patient Active Problem List   Diagnosis Date Noted   Hemorrhoid 09/21/2022   Weight loss counseling, encounter for 04/30/2022   Hyperglycemia 10/12/2021   Close exposure to COVID-19 virus 10/12/2021   Encounter for screening colonoscopy    Family history of colon cancer    GERD (gastroesophageal reflux disease) 06/13/2020   Stress 07/28/2018   Right knee pain 12/26/2017   Incomplete emptying of bladder 06/25/2017   Diverticulosis 06/24/2016   Benign localized hyperplasia of  prostate with urinary obstruction 01/02/2016   Vitamin D deficiency 11/30/2015   BMI 39.0-39.9,adult 11/30/2015   Nephrolithiasis 05/19/2015   Essential hypertension, benign 02/25/2015   Health care maintenance 12/12/2013   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   Elevated prostate specific antigen (PSA) 06/19/2012   ED (erectile dysfunction) of organic origin 06/19/2012   Microscopic hematuria 06/19/2012   Spermatocele 06/19/2012   Pure hypercholesterolemia 12/07/2011   Panic disorder without agoraphobia 12/07/2011   Adjustment disorder with anxiety 05/27/2011    Past Surgical History:  Procedure Laterality Date   CATARACT EXTRACTION W/PHACO Right 09/10/2017   Procedure: CATARACT EXTRACTION PHACO AND INTRAOCULAR LENS PLACEMENT (IOC)  RIGHT;  Surgeon: Lockie Mola, MD;  Location: [facility];  Service: Ophthalmology;  Laterality: Right;   COLONOSCOPY     COLONOSCOPY WITH PROPOFOL N/A 06/21/2015   Procedure: COLONOSCOPY WITH PROPOFOL;  Surgeon: Wallace Cullens, MD;  Location: [facility];  Service: Gastroenterology;  Laterality: N/A;   COLONOSCOPY WITH PROPOFOL N/A 09/10/2020   Procedure: COLONOSCOPY WITH PROPOFOL;  Surgeon: Midge Minium, MD;  Location: [facility];  Service: Endoscopy;  Laterality: N/A;   ESOPHAGOGASTRODUODENOSCOPY     LIVER CYST REMOVAL     fusobacterium, 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 Achilles Dunk, negative/inconclusive   TONSILLECTOMY  [phone]  child       Home Medications    Prior to Admission medications   Medication Sig Start Date End Date Taking? Authorizing Provider  allopurinol (ZYLOPRIM) 100 MG tablet Take 1 tablet (100 mg total) by mouth daily. 11/16/23  Yes Stoioff, Verna Czech, MD  amLODipine (NORVASC) 5 MG tablet TAKE 1 TABLET(5 MG) BY MOUTH DAILY 07/26/23  Yes Dale [place], MD  busPIRone (BUSPAR) 5 MG tablet One q pm prn 07/22/18  Yes Scott, Westley Hummer, MD  cyclobenzaprine (FLEXERIL) 10 MG tablet TAKE 1  TABLET(10 MG) BY MOUTH DAILY AS NEEDED FOR MUSCLE SPASMS 07/01/20  Yes Dale Sunman, MD  doxycycline (VIBRAMYCIN) 100 MG capsule Take 1 capsule (100 mg total) by mouth 2 (two) times daily for 7 days. 11/26/23 12/03/23 Yes Becky Augusta, NP  famotidine (PEPCID) 40 MG tablet TAKE 1 TABLET(40 MG) BY MOUTH DAILY 07/05/23  Yes Dale [place], MD  finasteride (PROSCAR) 5 MG tablet Take 1 tablet (5 mg total) by mouth daily. 11/16/23  Yes Stoioff, Verna Czech, MD  ibuprofen (ADVIL,MOTRIN) 200 MG tablet Take by mouth.   Yes [provider]  losartan-hydrochlorothiazide (HYZAAR) 100-25 MG tablet TAKE 1 TABLET BY MOUTH DAILY 05/21/23  Yes Dale [place], MD  MELATONIN PO Take by mouth.   Yes [provider]  metFORMIN (GLUCOPHAGE-XR) 500 MG 24 hr tablet TAKE 1 TABLET(500 MG) BY MOUTH DAILY WITH BREAKFAST 05/10/23  Yes Dale [place], MD  methylPREDNISolone (MEDROL DOSEPAK) 4 MG TBPK tablet Take according to the package insert. 11/26/23  Yes Becky Augusta, NP  Misc Natural Products (HERBAL ENERGY COMPLEX PO) Take by mouth.   Yes [provider]  Multiple Vitamin (MULTI-VITAMINS) TABS Take by mouth.   Yes [provider]  rosuvastatin (CRESTOR) 10 MG tablet TAKE 1 TABLET(10 MG) BY MOUTH DAILY 07/18/23  Yes Dale [place], MD  tadalafil (CIALIS) 20 MG tablet Take 1 tablet (20 mg total) by mouth daily as needed. 11/16/23  Yes Stoioff, Verna Czech, MD  valACYclovir (VALTREX) 500 MG tablet TAKE 1 TABLET(500 MG) BY MOUTH DAILY AS NEEDED FOR COLD SORES 11/21/23  Yes Dale [place], MD    Family History Family History  Problem Relation Age of Onset   Cancer Mother        Thyroid cancer   Hyperlipidemia Father    Dementia Father    Cancer Brother        colon   Cancer Maternal Grandmother        leukemia   Cancer Maternal Grandfather        colon, treated with surgical resection    Social History Social History   Tobacco Use   Smoking status: Never   Smokeless tobacco: Never   Vaping Use   Vaping status: Never Used  Substance Use Topics   Alcohol use: Yes    Comment: rare   Drug use: No     Allergies   Iodinated contrast media, Ace inhibitors, and Penicillins   Review of Systems Review of Systems  Constitutional:  Negative for fever.  HENT:  Positive for congestion, ear pain, hearing loss, rhinorrhea, sinus pressure and sinus pain. Negative for sore throat.   Respiratory:  Negative for cough.      Physical Exam Triage Vital Signs ED Triage Vitals  Encounter Vitals Group     BP 11/26/23 0926 137/80     Systolic BP Percentile --      Diastolic BP Percentile --      Pulse Rate 11/26/23 0926 90  Resp 11/26/23 0926 16     Temp 11/26/23 0926 98.5 F (36.9 C)     Temp Source 11/26/23 0926 Oral     SpO2 11/26/23 0926 96 %     Weight 11/26/23 0925 235 lb (106.6 kg)     Height 11/26/23 0925 5\' 7"  (1.702 m)     Head Circumference --      Peak Flow --      Pain Score 11/26/23 0929 0     Pain Loc --      Pain Education --      Exclude from Growth Chart --    No data found.  Updated Vital Signs BP 137/80 (BP Location: Left Arm)   Pulse 90   Temp 98.5 F (36.9 C) (Oral)   Resp 16   Ht 5\' 7"  (1.702 m)   Wt 235 lb (106.6 kg)   SpO2 96%   BMI 36.81 kg/m   Visual Acuity Right Eye Distance:   Left Eye Distance:   Bilateral Distance:    Right Eye Near:   Left Eye Near:    Bilateral Near:     Physical Exam Vitals and nursing note reviewed.  Constitutional:      Appearance: Normal appearance. He is not ill-appearing.  HENT:     Head: Normocephalic and atraumatic.     Right Ear: Tympanic membrane, ear canal and external ear normal. There is no impacted cerumen.     Left Ear: Ear canal and external ear normal. There is no impacted cerumen.     Ears:     Comments: Left hand is mildly erythematous.  Right TM is pearly gray in appearance.  Both EACs are clear.  No pain with external palpation of eustachian tubes.    Nose: Congestion and  rhinorrhea present.     Comments: Nasal mucosa is markedly edematous with bloody discharge in both nares.  No tenderness to compression of frontal or maxillary sinuses.    Mouth/Throat:     Mouth: Mucous membranes are moist.     Pharynx: Oropharynx is clear. No oropharyngeal exudate or posterior oropharyngeal erythema.  Musculoskeletal:     Cervical back: Normal range of motion and neck supple. No tenderness.  Lymphadenopathy:     Cervical: No cervical adenopathy.  Skin:    General: Skin is warm and dry.     Capillary Refill: Capillary refill takes less than 2 seconds.     Findings: No rash.  Neurological:     General: No focal deficit present.     Mental Status: He is alert and oriented to person, place, and time.      UC Treatments / Results  Labs (all labs ordered are listed, but only abnormal results are displayed) Labs Reviewed - No data to display  EKG   Radiology No results found.  Procedures Procedures (including critical care time)  Medications Ordered in UC Medications - No data to display  Initial Impression / Assessment and Plan / UC Course  I have reviewed the triage vital signs and the nursing notes.  Pertinent labs & imaging results that were available during my care of the patient were reviewed by me and considered in my medical decision making (see chart for details).   Patient is a nontoxic-appearing 68 year old male presenting for evaluation of 2 weeks worth of URI symptoms as outlined HPI above.  His physical exam does reveal erythema to the left tympanic membrane as well as inflammation of his nasal mucosa with bloody discharge.  No sinus tenderness noted on exam.  No cervical lymphadenopathy or tenderness with external palpation of eustachian tubes.  I did have the patient perform the Valsalva maneuver in the exam room and he is unable to close use eustachian tubes.  I suspect the patient has an upper respiratory infection as well as eustachian tube  dysfunction.  I will discharge him home on doxycycline as he has a rash allergy to penicillins and is never taken a cephalosporin per his report.  I will also provide a low-dose Medrol Dosepak to help with eustachian tube inflammation as well as sinus inflammation.  He should continue his nasal lavage.  If his symptoms not improve he is to follow-up with ENT.   Final Clinical Impressions(s) / UC Diagnoses   Final diagnoses:  Acute URI     Discharge Instructions      The Doxycycline twice daily with food for 7 days for treatment of your sinusitis.  Perform sinus irrigation 2-3 times a day with a NeilMed sinus rinse kit and distilled water.  Do not use tap water.  You can use plain over-the-counter Mucinex every 6 hours to break up the stickiness of the mucus so your body can clear it.  Increase your oral fluid intake to thin out your mucus so that is also able for your body to clear more easily.  Take an over-the-counter probiotic, such as Culturelle-align-activia, 1 hour after each dose of antibiotic to prevent diarrhea.  Start taking the Medrol Dosepak when you pick it up from the pharmacy.  Typically you take 2 tablets at breakfast time and 1 tablet at lunch.  I would take the first 3 tablets at lunch and then follow the dosing schedule.  You also the option of starting the Medrol Dosepak tomorrow morning at breakfast time.  If your symptoms do not improve, or they worsen, you can either return for reevaluation, or more appropriately you should follow-up with ENT..      ED Prescriptions     Medication Sig Dispense Auth. Provider   doxycycline (VIBRAMYCIN) 100 MG capsule Take 1 capsule (100 mg total) by mouth 2 (two) times daily for 7 days. 14 capsule Becky Augusta, NP   methylPREDNISolone (MEDROL DOSEPAK) 4 MG TBPK tablet Take according to the package insert. 1 each Becky Augusta, NP      PDMP not reviewed this encounter.   Becky Augusta, NP 11/26/23 [phone]

## 2023-11-27 ENCOUNTER — Telehealth: Payer: Self-pay

## 2023-11-27 DIAGNOSIS — E7800 Pure hypercholesterolemia, unspecified: Secondary | ICD-10-CM

## 2023-11-27 DIAGNOSIS — R739 Hyperglycemia, unspecified: Secondary | ICD-10-CM

## 2023-11-27 DIAGNOSIS — Z87898 Personal history of other specified conditions: Secondary | ICD-10-CM

## 2023-11-27 DIAGNOSIS — I10 Essential (primary) hypertension: Secondary | ICD-10-CM

## 2023-11-27 NOTE — Telephone Encounter (Signed)
 Copied from CRM [id number]. Topic: Clinical - Request for Lab/Test Order >> Nov 27, 2023  9:40 AM Corin V wrote: Reason for CRM: Patient would like to have his labs entered for his upcoming appointment 3/17. He said they should be all his physical labs.

## 2023-11-27 NOTE — Telephone Encounter (Signed)
 Labs scheduled

## 2023-11-28 NOTE — Addendum Note (Signed)
 Addended by: Rita Ohara D on: 11/28/2023 07:40 AM   Modules accepted: Orders

## 2023-12-07 ENCOUNTER — Other Ambulatory Visit

## 2023-12-07 DIAGNOSIS — E7800 Pure hypercholesterolemia, unspecified: Secondary | ICD-10-CM | POA: Diagnosis not present

## 2023-12-07 DIAGNOSIS — R739 Hyperglycemia, unspecified: Secondary | ICD-10-CM

## 2023-12-07 DIAGNOSIS — I10 Essential (primary) hypertension: Secondary | ICD-10-CM | POA: Diagnosis not present

## 2023-12-07 LAB — HEPATIC FUNCTION PANEL
ALT: 29 U/L (ref 0–53)
AST: 16 U/L (ref 0–37)
Albumin: 3.7 g/dL (ref 3.5–5.2)
Alkaline Phosphatase: 71 U/L (ref 39–117)
Bilirubin, Direct: 0.1 mg/dL (ref 0.0–0.3)
Total Bilirubin: 0.5 mg/dL (ref 0.2–1.2)
Total Protein: 6 g/dL (ref 6.0–8.3)

## 2023-12-07 LAB — CBC WITH DIFFERENTIAL/PLATELET
Basophils Absolute: 0 10*3/uL (ref 0.0–0.1)
Basophils Relative: 0.3 % (ref 0.0–3.0)
Eosinophils Absolute: 0.2 10*3/uL (ref 0.0–0.7)
Eosinophils Relative: 1.8 % (ref 0.0–5.0)
HCT: 43.7 % (ref 39.0–52.0)
Hemoglobin: 14.1 g/dL (ref 13.0–17.0)
Lymphocytes Relative: 18.9 % (ref 12.0–46.0)
Lymphs Abs: 1.9 10*3/uL (ref 0.7–4.0)
MCHC: 32.3 g/dL (ref 30.0–36.0)
MCV: 85.8 fl (ref 78.0–100.0)
Monocytes Absolute: 0.8 10*3/uL (ref 0.1–1.0)
Monocytes Relative: 7.9 % (ref 3.0–12.0)
Neutro Abs: 7 10*3/uL (ref 1.4–7.7)
Neutrophils Relative %: 71.1 % (ref 43.0–77.0)
Platelets: 310 10*3/uL (ref 150.0–400.0)
RBC: 5.09 Mil/uL (ref 4.22–5.81)
RDW: 15.3 % (ref 11.5–15.5)
WBC: 9.8 10*3/uL (ref 4.0–10.5)

## 2023-12-07 LAB — LIPID PANEL
Cholesterol: 120 mg/dL (ref 0–200)
HDL: 42.8 mg/dL (ref >39.00)
LDL Cholesterol: 60 mg/dL (ref 0–99)
NonHDL: 77.56
Total CHOL/HDL Ratio: 3
Triglycerides: 90 mg/dL (ref 0.0–149.0)
VLDL: 18 mg/dL (ref 0.0–40.0)

## 2023-12-07 LAB — HEMOGLOBIN A1C: Hgb A1c MFr Bld: 6.2 % (ref 4.6–6.5)

## 2023-12-07 LAB — BASIC METABOLIC PANEL
BUN: 24 mg/dL — ABNORMAL HIGH (ref 6–23)
CO2: 30 meq/L (ref 19–32)
Calcium: 9.4 mg/dL (ref 8.4–10.5)
Chloride: 106 meq/L (ref 96–112)
Creatinine, Ser: 1.1 mg/dL (ref 0.40–1.50)
GFR: 68.4 mL/min (ref >60.00)
Glucose, Bld: 102 mg/dL — ABNORMAL HIGH (ref 70–99)
Potassium: 4.4 meq/L (ref 3.5–5.1)
Sodium: 143 meq/L (ref 135–145)

## 2023-12-07 LAB — TSH: TSH: 1.75 u[IU]/mL (ref 0.35–5.50)

## 2023-12-10 ENCOUNTER — Encounter: Payer: Self-pay | Admitting: Internal Medicine

## 2023-12-10 ENCOUNTER — Ambulatory Visit (INDEPENDENT_AMBULATORY_CARE_PROVIDER_SITE_OTHER): Admitting: Internal Medicine

## 2023-12-10 ENCOUNTER — Ambulatory Visit: Payer: Medicare PPO | Admitting: Internal Medicine

## 2023-12-10 VITALS — BP 116/70 | HR 97 | Temp 98.1°F | Ht 67.0 in | Wt 237.0 lb

## 2023-12-10 DIAGNOSIS — Z713 Dietary counseling and surveillance: Secondary | ICD-10-CM

## 2023-12-10 DIAGNOSIS — E7800 Pure hypercholesterolemia, unspecified: Secondary | ICD-10-CM | POA: Diagnosis not present

## 2023-12-10 DIAGNOSIS — I10 Essential (primary) hypertension: Secondary | ICD-10-CM

## 2023-12-10 DIAGNOSIS — R739 Hyperglycemia, unspecified: Secondary | ICD-10-CM | POA: Diagnosis not present

## 2023-12-10 DIAGNOSIS — Z0000 Encounter for general adult medical examination without abnormal findings: Secondary | ICD-10-CM | POA: Diagnosis not present

## 2023-12-10 MED ORDER — AMLODIPINE BESYLATE 5 MG PO TABS: ORAL_TABLET | ORAL | 3 refills | Status: DC

## 2023-12-10 MED ORDER — ROSUVASTATIN CALCIUM 10 MG PO TABS: 10.0000 mg | ORAL_TABLET | Freq: Every day | ORAL | 3 refills | Status: DC

## 2023-12-10 MED ORDER — METFORMIN HCL ER 500 MG PO TB24: 500.0000 mg | ORAL_TABLET | Freq: Every day | ORAL | 3 refills | Status: DC

## 2023-12-10 MED ORDER — LOSARTAN POTASSIUM-HCTZ 100-25 MG PO TABS: 1.0000 | ORAL_TABLET | Freq: Every day | ORAL | 3 refills | Status: DC

## 2023-12-10 MED ORDER — FLUTICASONE PROPIONATE 50 MCG/ACT NA SUSP: 2.0000 | Freq: Every day | NASAL | 6 refills | Status: AC

## 2023-12-10 MED ORDER — FAMOTIDINE 40 MG PO TABS: ORAL_TABLET | ORAL | 3 refills | Status: DC

## 2023-12-10 NOTE — Assessment & Plan Note (Signed)
 Physical today 12/10/23.  Colonoscopy 05/2015.  Had recommended f/u in 5 years.  Follow up colonoscopy 08/2020 - internal hemorrhoids and diverticulosis.  Recommended f/u in 5 years.  Followed by Dr Lonna Cobb.

## 2023-12-10 NOTE — Progress Notes (Signed)
 Subjective:    Patient ID: Scott Crawford, male    DOB: 1956-05-24, 68 y.o.   MRN: [id number]  Patient here for  Chief Complaint  Patient presents with   Annual Exam    HPI Here for a physical exam. Evaluated urgent care - increased sinus pressure and ear fullness. Treated with medrol dosepak and doxycycline. Reports is better. Some decreased energy. Had f/u with Dr Lonna Cobb 11/16/23 - f/u BPH - stable. Continue finasteride. Had questions about GLP 1 agonist. Discussed family history of thyroid cancer. Discussed diet and exercise. Breathing stable. No increased sob reported.    Past Medical History:  Diagnosis Date   Allergy    Anxiety    Cataract    DDD (degenerative disc disease), lumbar    Elevated PSA    Followed by Dr. Achilles Dunk   Epididymal cyst    GERD (gastroesophageal reflux disease)    H/O gastroesophageal reflux (GERD)    Herpes simplex    History of hiatal hernia    Hyperlipidemia    Hypertension    IBS (irritable bowel syndrome)    Liver abscess    Lumbar back pain    ruptured L5   Motion sickness    deep sea fishing   Obesity    Panic disorder without agoraphobia    Wears hearing aid in both ears    Past Surgical History:  Procedure Laterality Date   CATARACT EXTRACTION W/PHACO Right 09/10/2017   Procedure: CATARACT EXTRACTION PHACO AND INTRAOCULAR LENS PLACEMENT (IOC)  RIGHT;  Surgeon: Lockie Mola, MD;  Location: [facility];  Service: Ophthalmology;  Laterality: Right;   COLONOSCOPY     COLONOSCOPY WITH PROPOFOL N/A 06/21/2015   Procedure: COLONOSCOPY WITH PROPOFOL;  Surgeon: Wallace Cullens, MD;  Location: [facility];  Service: Gastroenterology;  Laterality: N/A;   COLONOSCOPY WITH PROPOFOL N/A 09/10/2020   Procedure: COLONOSCOPY WITH PROPOFOL;  Surgeon: Midge Minium, MD;  Location: [facility];  Service: Endoscopy;  Laterality: N/A;   ESOPHAGOGASTRODUODENOSCOPY     EYE SURGERY     LIVER CYST REMOVAL     fusobacterium, Dr.  Leavy Cella   PROSTATE BIOPSY  09/26/2011   Dr. Achilles Dunk, negative/inconclusive   TONSILLECTOMY  09/25/1960   child   Family History  Problem Relation Age of Onset   Cancer Mother        Thyroid cancer   Hyperlipidemia Father    Dementia Father    Cancer Brother        colon   Cancer Maternal Grandmother        leukemia   Cancer Maternal Grandfather        colon, treated with surgical resection   Cancer Paternal Grandfather    Cancer Paternal Grandmother    Social History   Socioeconomic History   Marital status: Married    Spouse name: Not on file   Number of children: Not on file   Years of education: Not on file   Highest education level: Bachelor's degree (e.g., BA, AB, BS)  Occupational History   Not on file  Tobacco Use   Smoking status: Never   Smokeless tobacco: Never  Vaping Use   Vaping status: Never Used  Substance and Sexual Activity   Alcohol use: Yes    Comment: rare   Drug use: No   Sexual activity: Yes  Other Topics Concern   Not on file  Social History Narrative   Lives in [place] with wife. One daughter.  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 Worked for [employer]       Diet - regular      Exercise - limited   Social Drivers of Health   Financial Resource Strain: Low Risk  (12/06/2023)   Overall Financial Resource Strain (CARDIA)    Difficulty of Paying Living Expenses: Not hard at all  Food Insecurity: No Food Insecurity (12/06/2023)   Hunger Vital Sign    Worried About Running Out of Food in the Last Year: Never true    Ran Out of Food in the Last Year: Never true  Transportation Needs: No Transportation Needs (12/06/2023)   PRAPARE - Administrator, Civil Service (Medical): No    Lack of Transportation (Non-Medical): No  Physical Activity: Insufficiently Active (12/06/2023)   Exercise Vital Sign    Days of Exercise per Week: 2 days    Minutes of Exercise per Session: 10 min  Stress: No Stress Concern Present  (12/06/2023)   [employer] of Occupational Health - Occupational Stress Questionnaire    Feeling of Stress : Only a little  Social Connections: Socially Integrated (12/06/2023)   Social Connection and Isolation Panel [NHANES]    Frequency of Communication with Friends and Family: More than three times a week    Frequency of Social Gatherings with Friends and Family: More than three times a week    Attends Religious Services: More than 4 times per year    Active Member of [employer] or Organizations: Yes    Attends Engineer, structural: More than 4 times per year    Marital Status: Married     Review of Systems  Constitutional:  Negative for appetite change and unexpected weight change.  HENT:  Negative for congestion, sinus pressure and sore throat.   Eyes:  Negative for pain and visual disturbance.  Respiratory:  Negative for cough, chest tightness and shortness of breath.   Cardiovascular:  Negative for chest pain, palpitations and leg swelling.  Gastrointestinal:  Negative for abdominal pain, diarrhea, nausea and vomiting.  Genitourinary:  Negative for difficulty urinating and dysuria.  Musculoskeletal:  Negative for joint swelling and myalgias.  Skin:  Negative for color change and rash.  Neurological:  Negative for dizziness and headaches.  Hematological:  Negative for adenopathy. Does not bruise/bleed easily.  Psychiatric/Behavioral:  Negative for agitation and dysphoric mood.        Objective:     BP 116/70   Pulse 97   Temp 98.1 F (36.7 C) (Oral)   Ht 5\' 7"  (1.702 m)   Wt 237 lb (107.5 kg)   SpO2 92%   BMI 37.12 kg/m  Wt Readings from Last 3 Encounters:  12/10/23 237 lb (107.5 kg)  11/26/23 235 lb (106.6 kg)  11/16/23 225 lb (102.1 kg)    Physical Exam Constitutional:      General: He is not in acute distress.    Appearance: Normal appearance. He is well-developed.  HENT:     Head: Normocephalic and atraumatic.     Right Ear: External ear normal.      Left Ear: External ear normal.     Mouth/Throat:     Pharynx: No oropharyngeal exudate or posterior oropharyngeal erythema.  Eyes:     General: No scleral icterus.       Right eye: No discharge.        Left eye: No discharge.     Conjunctiva/sclera: Conjunctivae normal.  Neck:  Thyroid: No thyromegaly.  Cardiovascular:     Rate and Rhythm: Normal rate and regular rhythm.  Pulmonary:     Effort: No respiratory distress.     Breath sounds: Normal breath sounds. No wheezing.  Abdominal:     General: Bowel sounds are normal.     Palpations: Abdomen is soft.     Tenderness: There is no abdominal tenderness.  Musculoskeletal:        General: No swelling or tenderness.     Cervical back: Neck supple. No tenderness.  Lymphadenopathy:     Cervical: No cervical adenopathy.  Skin:    Findings: No erythema or rash.  Neurological:     Mental Status: He is alert and oriented to person, place, and time.  Psychiatric:        Mood and Affect: Mood normal.        Behavior: Behavior normal.         Outpatient Encounter Medications as of 12/10/2023  Medication Sig   allopurinol (ZYLOPRIM) 100 MG tablet Take 1 tablet (100 mg total) by mouth daily.   busPIRone (BUSPAR) 5 MG tablet One q pm prn   finasteride (PROSCAR) 5 MG tablet Take 1 tablet (5 mg total) by mouth daily.   fluticasone (FLONASE) 50 MCG/ACT nasal spray Place 2 sprays into both nostrils daily.   ibuprofen (ADVIL,MOTRIN) 200 MG tablet Take by mouth.   MELATONIN PO Take by mouth.   Misc Natural Products (HERBAL ENERGY COMPLEX PO) Take by mouth.   Multiple Vitamin (MULTI-VITAMINS) TABS Take by mouth.   tadalafil (CIALIS) 20 MG tablet Take 1 tablet (20 mg total) by mouth daily as needed.   valACYclovir (VALTREX) 500 MG tablet TAKE 1 TABLET(500 MG) BY MOUTH DAILY AS NEEDED FOR COLD SORES   [DISCONTINUED] amLODipine (NORVASC) 5 MG tablet TAKE 1 TABLET(5 MG) BY MOUTH DAILY   [DISCONTINUED] famotidine (PEPCID) 40 MG tablet  TAKE 1 TABLET(40 MG) BY MOUTH DAILY   [DISCONTINUED] losartan-hydrochlorothiazide (HYZAAR) 100-25 MG tablet TAKE 1 TABLET BY MOUTH DAILY   [DISCONTINUED] metFORMIN (GLUCOPHAGE-XR) 500 MG 24 hr tablet TAKE 1 TABLET(500 MG) BY MOUTH DAILY WITH BREAKFAST   [DISCONTINUED] rosuvastatin (CRESTOR) 10 MG tablet TAKE 1 TABLET(10 MG) BY MOUTH DAILY   amLODipine (NORVASC) 5 MG tablet TAKE 1 TABLET(5 MG) BY MOUTH DAILY   famotidine (PEPCID) 40 MG tablet TAKE 1 TABLET(40 MG) BY MOUTH DAILY   losartan-hydrochlorothiazide (HYZAAR) 100-25 MG tablet Take 1 tablet by mouth daily.   metFORMIN (GLUCOPHAGE-XR) 500 MG 24 hr tablet Take 1 tablet (500 mg total) by mouth daily.   rosuvastatin (CRESTOR) 10 MG tablet Take 1 tablet (10 mg total) by mouth daily.   [DISCONTINUED] cyclobenzaprine (FLEXERIL) 10 MG tablet TAKE 1 TABLET(10 MG) BY MOUTH DAILY AS NEEDED FOR MUSCLE SPASMS (Patient not taking: Reported on 12/10/2023)   [DISCONTINUED] methylPREDNISolone (MEDROL DOSEPAK) 4 MG TBPK tablet Take according to the package insert.   No facility-administered encounter medications on file as of 12/10/2023.     Lab Results  Component Value Date   WBC 9.8 12/07/2023   HGB 14.1 12/07/2023   HCT 43.7 12/07/2023   PLT 310.0 12/07/2023   GLUCOSE 102 (H) 12/07/2023   CHOL 120 12/07/2023   TRIG 90.0 12/07/2023   HDL 42.80 12/07/2023   LDLDIRECT 123.7 10/07/2009   LDLCALC 60 12/07/2023   ALT 29 12/07/2023   AST 16 12/07/2023   NA 143 12/07/2023   K 4.4 12/07/2023   CL 106 12/07/2023   CREATININE 1.10  12/07/2023   BUN 24 (H) 12/07/2023   CO2 30 12/07/2023   TSH 1.75 12/07/2023   PSA 2.16 10/07/2009   HGBA1C 6.2 12/07/2023   MICROALBUR <0.7 02/25/2015       Assessment & Plan:  Health care maintenance Assessment & Plan: Physical today 12/10/23.  Colonoscopy 05/2015.  Had recommended f/u in 5 years.  Follow up colonoscopy 08/2020 - internal hemorrhoids and diverticulosis.  Recommended f/u in 5 years.  Followed by Dr  Lonna Cobb.    Essential hypertension, benign Assessment & Plan: Blood pressure as outlined on amlodipine and losartan/hctz.  Follow pressures.  Follow metabolic panel.  No changes today in medication.   Orders: -     Basic metabolic panel; Future -     CBC with Differential/Platelet; Future  Pure hypercholesterolemia Assessment & Plan: Low cholesterol diet and exercise. Follow lipid panel and liver function tests.   Orders: -     Hepatic function panel; Future -     Lipid panel; Future -     TSH; Future  Hyperglycemia Assessment & Plan: Low carb diet and exercise. On metformin. Discussed GLP 1 agonist as outlined. Follow met b and A1c.   Orders: -     Hemoglobin A1c; Future  Weight loss counseling, encounter for Assessment & Plan: Discussed low carb diet and exercise. Has been on metformin. Elevated A1c. Discussed GLP 1 agonist. Mother with history of thyroid cancer. See if can determine type of cancer. Follow.    Other orders -     amLODIPine Besylate; TAKE 1 TABLET(5 MG) BY MOUTH DAILY  Dispense: 90 tablet; Refill: 3 -     Famotidine; TAKE 1 TABLET(40 MG) BY MOUTH DAILY  Dispense: 90 tablet; Refill: 3 -     Losartan Potassium-HCTZ; Take 1 tablet by mouth daily.  Dispense: 90 tablet; Refill: 3 -     metFORMIN HCl ER; Take 1 tablet (500 mg total) by mouth daily.  Dispense: 90 tablet; Refill: 3 -     Rosuvastatin Calcium; Take 1 tablet (10 mg total) by mouth daily.  Dispense: 90 tablet; Refill: 3 -     Fluticasone Propionate; Place 2 sprays into both nostrils daily.  Dispense: 16 g; Refill: 6     Dale [place], MD

## 2023-12-15 ENCOUNTER — Encounter: Payer: Self-pay | Admitting: Internal Medicine

## 2023-12-16 ENCOUNTER — Telehealth: Payer: Self-pay | Admitting: Internal Medicine

## 2023-12-16 ENCOUNTER — Encounter: Payer: Self-pay | Admitting: Internal Medicine

## 2023-12-16 NOTE — Telephone Encounter (Signed)
 See me before calling him. He was wanting to start GLP 1 agonist. His mother had thyroid cancer. See 05/02/2022 phone message.

## 2023-12-16 NOTE — Assessment & Plan Note (Signed)
 Blood pressure as outlined on amlodipine and losartan/hctz.  Follow pressures.  Follow metabolic panel.  No changes today in medication.

## 2023-12-16 NOTE — Assessment & Plan Note (Signed)
Low cholesterol diet and exercise.  Follow lipid panel and liver function tests.  

## 2023-12-16 NOTE — Assessment & Plan Note (Signed)
 Discussed low carb diet and exercise. Has been on metformin. Elevated A1c. Discussed GLP 1 agonist. Mother with history of thyroid cancer. See if can determine type of cancer. Follow.

## 2023-12-16 NOTE — Assessment & Plan Note (Signed)
 Low carb diet and exercise. On metformin. Discussed GLP 1 agonist as outlined. Follow met b and A1c.

## 2023-12-17 NOTE — Telephone Encounter (Signed)
 See phone note

## 2023-12-17 NOTE — Telephone Encounter (Signed)
 Copied from CRM [id number]. Topic: General - Other >> Dec 17, 2023  2:23 PM Gurney Maxin H wrote: Reason for CRM: Patient returning call to clinic, agent advised patient of reason for call per notes as stated and patient declined answering agent and stated it was more detailed and wanted to speak with Bethann Berkshire, please reach back out to patient, thanks.   [place] [phone]

## 2023-12-17 NOTE — Telephone Encounter (Signed)
 [facility]. Need to know if he was able to find out what type of thyroid cancer his mother had.

## 2023-12-18 NOTE — Telephone Encounter (Signed)
 Called patient back. He will get documentation of type of thyroid cancer his mom had and reach back out. Pt also inquiring about how rx works (will medication be compounded, etc) Advised that medication would be sent to his local pharmacy to see if insurance covers. If not, then we could send rx to [facility] for [facility].

## 2023-12-19 ENCOUNTER — Telehealth: Payer: Self-pay

## 2023-12-19 ENCOUNTER — Other Ambulatory Visit (HOSPITAL_COMMUNITY): Payer: Self-pay

## 2023-12-19 MED ORDER — TIRZEPATIDE-WEIGHT MANAGEMENT 2.5 MG/0.5ML ~~LOC~~ SOLN: 2.5000 mg | SUBCUTANEOUS | 2 refills | Status: DC

## 2023-12-19 NOTE — Telephone Encounter (Signed)
 Patient says his mom had follicular carcinoma. No history of medullary thyroid cancer. Would like to have zepbound sent to [facility]

## 2023-12-19 NOTE — Telephone Encounter (Signed)
 Pt called back to speak with you. I called [employer] and they informed me to send a message back for you. Please call and advise.

## 2023-12-19 NOTE — Telephone Encounter (Signed)
 Rx sent in to [facility]. Have him call with update regarding tolerance of medication, etc.

## 2023-12-19 NOTE — Telephone Encounter (Signed)
 Pharmacy Patient Advocate Encounter   Received notification from CoverMyMeds that prior authorization for Zepbound 2.5MG /0.5ML pen-injectors is required/requested.   Insurance verification completed.   The patient is insured through [place] .   Per test claim: PA required; PA submitted to above mentioned insurance via CoverMyMeds Key/confirmation #/EOC (Key: [facility])  Status is pending

## 2023-12-19 NOTE — Telephone Encounter (Signed)
 Patient aware.

## 2023-12-19 NOTE — Addendum Note (Signed)
 Addended by: Charm Barges on: 12/19/2023 02:12 PM   Modules accepted: Orders

## 2023-12-20 ENCOUNTER — Telehealth: Payer: Self-pay | Admitting: Internal Medicine

## 2023-12-20 DIAGNOSIS — Z6837 Body mass index (BMI) 37.0-37.9, adult: Secondary | ICD-10-CM

## 2023-12-20 NOTE — Telephone Encounter (Signed)
 Regarding Zepbound 2.5 mg  Copied from CRM [id number]. Topic: Clinical - Medication Question >> Dec 20, 2023  2:57 PM Saverio Danker wrote: Reason for CRM: Patient was calling in due to a voicemail he received.  Patient said the script was denied by insurance and he would like to know what will be the next steps

## 2023-12-20 NOTE — Telephone Encounter (Signed)
 Pharmacy Patient Advocate Encounter  Received notification from [facility] that Prior Authorization for Zepbound 2.5MG /0.5MLhas been DENIED.  Full denial letter will be uploaded to the media tab. See denial reason below.  Medicae Part D doesn't cover weightloss drugs

## 2023-12-21 NOTE — Telephone Encounter (Signed)
 See other note

## 2023-12-21 NOTE — Telephone Encounter (Signed)
 Insurance denied his zepbound. Ok to send in vials to [facility] [facility]?

## 2023-12-23 NOTE — Telephone Encounter (Signed)
 Ok to send in vials - starting zepbound 2.5mg  qeekly.

## 2023-12-24 MED ORDER — TIRZEPATIDE-WEIGHT MANAGEMENT 2.5 MG/0.5ML ~~LOC~~ SOLN: 2.5000 mg | SUBCUTANEOUS | 2 refills | Status: DC

## 2023-12-24 NOTE — Telephone Encounter (Signed)
 Sent to [facility]. Patient is to schedule a nurse visit for administration teaching once medication is received. Patient is aware.

## 2023-12-24 NOTE — Addendum Note (Signed)
 Addended by: Rita Ohara D on: 12/24/2023 10:48 AM   Modules accepted: Orders

## 2024-01-02 ENCOUNTER — Ambulatory Visit

## 2024-01-02 ENCOUNTER — Other Ambulatory Visit (INDEPENDENT_AMBULATORY_CARE_PROVIDER_SITE_OTHER)

## 2024-01-02 DIAGNOSIS — E7800 Pure hypercholesterolemia, unspecified: Secondary | ICD-10-CM | POA: Diagnosis not present

## 2024-01-02 DIAGNOSIS — R739 Hyperglycemia, unspecified: Secondary | ICD-10-CM

## 2024-01-02 DIAGNOSIS — I10 Essential (primary) hypertension: Secondary | ICD-10-CM | POA: Diagnosis not present

## 2024-01-02 DIAGNOSIS — Z6837 Body mass index (BMI) 37.0-37.9, adult: Secondary | ICD-10-CM | POA: Diagnosis not present

## 2024-01-02 LAB — BASIC METABOLIC PANEL WITH GFR
BUN: 21 mg/dL (ref 6–23)
CO2: 30 meq/L (ref 19–32)
Calcium: 9.6 mg/dL (ref 8.4–10.5)
Chloride: 102 meq/L (ref 96–112)
Creatinine, Ser: 0.96 mg/dL (ref 0.40–1.50)
GFR: 80.5 mL/min (ref >60.00)
Glucose, Bld: 107 mg/dL — ABNORMAL HIGH (ref 70–99)
Potassium: 4.2 meq/L (ref 3.5–5.1)
Sodium: 139 meq/L (ref 135–145)

## 2024-01-02 NOTE — Addendum Note (Signed)
 Addended by: Jarvis Morgan D on: 01/02/2024 01:39 PM   Modules accepted: Orders

## 2024-01-02 NOTE — Progress Notes (Signed)
 Pt presented in to office for Zepbound teaching . Pt had zepbound vial not the pen I instructed him on how to wash his hands wipe off area that he is going to administer the medication, uncap needle never recap instructed him to switch area each time he gives it,  told him do not give in navel area also to  put all medication into the needle because it is single valve use push medication until needle is empty dispose of needle in proper container throw vial in trash

## 2024-01-02 NOTE — Addendum Note (Signed)
 Addended by: Jarvis Morgan D on: 01/02/2024 01:38 PM   Modules accepted: Orders

## 2024-01-03 ENCOUNTER — Encounter: Payer: Self-pay | Admitting: Internal Medicine

## 2024-01-05 ENCOUNTER — Other Ambulatory Visit: Payer: Self-pay | Admitting: Internal Medicine

## 2024-01-14 ENCOUNTER — Other Ambulatory Visit: Payer: Self-pay | Admitting: Internal Medicine

## 2024-01-17 ENCOUNTER — Telehealth: Payer: Self-pay

## 2024-01-17 NOTE — Telephone Encounter (Signed)
 Called patient. He is aware that you are out of the office. He has been on the 2.5mg  and tolerating well. He has noticed that the 2.5 is not decreasing his appetite as much as he would like. Ok to send in the 5 mg for him to [place] direct?

## 2024-01-17 NOTE — Telephone Encounter (Signed)
 Copied from CRM [id number]. Topic: Clinical - Medication Question >> Jan 17, 2024 10:24 AM Martinique E wrote: Reason for CRM: Patient called in questioning if it is time to increase the dosage of his tirzepatide (ZEPBOUND) 2.5 MG/0.5ML injection vial medication. Patient would like to go up to 5 MG as he is currently on the 2.5 MG. Callback number for patient is [phone] to discuss.

## 2024-01-17 NOTE — Telephone Encounter (Signed)
 Given tolerating zepbound 2.5mg  dose, ok to increase to 5mg  weekly

## 2024-01-18 MED ORDER — TIRZEPATIDE-WEIGHT MANAGEMENT 5 MG/0.5ML ~~LOC~~ SOLN: 5.0000 mg | SUBCUTANEOUS | 2 refills | Status: DC

## 2024-01-18 NOTE — Addendum Note (Signed)
 Addended by: Victorino Grates D on: 01/18/2024 10:33 AM   Modules accepted: Orders

## 2024-01-18 NOTE — Telephone Encounter (Signed)
 5 mg dose sent in. Patient is aware.

## 2024-02-27 ENCOUNTER — Telehealth: Payer: Self-pay

## 2024-02-27 NOTE — Telephone Encounter (Signed)
 Copied from CRM [id number]. Topic: Clinical - Medication Question >> Feb 27, 2024  1:54 PM Scott Crawford wrote: Reason for CRM: Patient would like to speak with Dr. Shaunna [place] nurse regarding prescription Zepbound. Please call [phone]

## 2024-02-28 NOTE — Telephone Encounter (Signed)
 Pt has been on 5 mg zepbound for 8 weeks and tolerating well. Would like to go up to 7.5. Ok to send in to [facility]?

## 2024-02-28 NOTE — Telephone Encounter (Signed)
 Ok. Needs an earlier f/u appt given on zepbound. He does not have one scheduled until 08/2024.

## 2024-02-29 MED ORDER — TIRZEPATIDE-WEIGHT MANAGEMENT 7.5 MG/0.5ML ~~LOC~~ SOLN: 7.5000 mg | SUBCUTANEOUS | 2 refills | Status: DC

## 2024-02-29 NOTE — Addendum Note (Signed)
 Addended by: Victorino Grates D on: 02/29/2024 09:16 AM   Modules accepted: Orders

## 2024-03-07 ENCOUNTER — Ambulatory Visit: Admitting: Internal Medicine

## 2024-03-07 VITALS — BP 104/68 | HR 75 | Temp 98.2°F | Resp 16 | Ht 67.0 in | Wt 241.0 lb

## 2024-03-07 DIAGNOSIS — Z713 Dietary counseling and surveillance: Secondary | ICD-10-CM

## 2024-03-07 DIAGNOSIS — K219 Gastro-esophageal reflux disease without esophagitis: Secondary | ICD-10-CM | POA: Diagnosis not present

## 2024-03-07 DIAGNOSIS — R739 Hyperglycemia, unspecified: Secondary | ICD-10-CM | POA: Diagnosis not present

## 2024-03-07 DIAGNOSIS — I10 Essential (primary) hypertension: Secondary | ICD-10-CM | POA: Diagnosis not present

## 2024-03-07 DIAGNOSIS — E7800 Pure hypercholesterolemia, unspecified: Secondary | ICD-10-CM

## 2024-03-07 MED ORDER — PANTOPRAZOLE SODIUM 40 MG PO TBEC: 40.0000 mg | DELAYED_RELEASE_TABLET | Freq: Every day | ORAL | 1 refills | Status: DC

## 2024-03-07 NOTE — Progress Notes (Unsigned)
 Subjective:    Patient ID: Scott Crawford, male    DOB: 10/27/1955, 68 y.o.   MRN: [id number]  Patient here for  Chief Complaint  Patient presents with  . Medical Management of Chronic Issues    HPI Here for a scheduled follow up -    Past Medical History:  Diagnosis Date  . Allergy   . Anxiety   . Cataract   . DDD (degenerative disc disease), lumbar   . Elevated PSA    Followed by Dr. Aram Knights  . Epididymal cyst   . GERD (gastroesophageal reflux disease)   . H/O gastroesophageal reflux (GERD)   . Herpes simplex   . History of hiatal hernia   . Hyperlipidemia   . Hypertension   . IBS (irritable bowel syndrome)   . Liver abscess   . Lumbar back pain    ruptured L5  . Motion sickness    deep sea fishing  . Obesity   . Panic disorder without agoraphobia   . Wears hearing aid in both ears    Past Surgical History:  Procedure Laterality Date  . CATARACT EXTRACTION W/PHACO Right 09/10/2017   Procedure: CATARACT EXTRACTION PHACO AND INTRAOCULAR LENS PLACEMENT (IOC)  RIGHT;  Surgeon: Annell Kidney, MD;  Location: [facility];  Service: Ophthalmology;  Laterality: Right;  . COLONOSCOPY    . COLONOSCOPY WITH PROPOFOL  N/A 06/21/2015   Procedure: COLONOSCOPY WITH PROPOFOL ;  Surgeon: Stephens Eis, MD;  Location: [facility] END[facility]COPY;  Service: Gastroenterology;  Laterality: N/A;  . COLONOSCOPY WITH PROPOFOL  N/A 09/10/2020   Procedure: COLONOSCOPY WITH PROPOFOL ;  Surgeon: Marnee Sink, MD;  Location: [facility];  Service: Endoscopy;  Laterality: N/A;  . ESOPHAGOGASTRODUODENOSCOPY    . EYE SURGERY    . LIVER CYST REMOVAL     fusobacterium, Dr. Nicola Barre  . PROSTATE BIOPSY  09/26/2011   Dr. Aram Knights, negative/inconclusive  . TONSILLECTOMY  09/25/1960   child   Family History  Problem Relation Age of Onset  . Cancer Mother        Thyroid  cancer  . Hyperlipidemia Father   . Dementia Father   . Cancer Brother        colon  . Cancer Maternal Grandmother         leukemia  . Cancer Maternal Grandfather        colon, treated with surgical resection  . Cancer Paternal Grandfather   . Cancer Paternal Grandmother    Social History   Socioeconomic History  . Marital status: Married    Spouse name: Not on file  . Number of children: Not on file  . Years of education: Not on file  . Highest education level: Bachelor's degree (e.g., BA, AB, BS)  Occupational History  . Not on file  Tobacco Use  . Smoking status: Never  . Smokeless tobacco: Never  Vaping Use  . Vaping status: Never Used  Substance and Sexual Activity  . Alcohol use: Yes    Comment: rare  . Drug use: No  . Sexual activity: Yes  Other Topics Concern  . Not on file  Social History Narrative   Lives in [place] with wife. One daughter.      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 Worked for [employer]       Diet - regular      Exercise - limited   Social Drivers of Health   Financial Resource Strain: Low Risk  (12/06/2023)   Overall Physicist, medical  Strain (CARDIA)   . Difficulty of Paying Living Expenses: Not hard at all  Food Insecurity: No Food Insecurity (12/06/2023)   Hunger Vital Sign   . Worried About Programme researcher, broadcasting/film/video in the Last Year: Never true   . Ran Out of Food in the Last Year: Never true  Transportation Needs: No Transportation Needs (12/06/2023)   PRAPARE - Transportation   . Lack of Transportation (Medical): No   . Lack of Transportation (Non-Medical): No  Physical Activity: Insufficiently Active (12/06/2023)   Exercise Vital Sign   . Days of Exercise per Week: 2 days   . Minutes of Exercise per Session: 10 min  Stress: No Stress Concern Present (12/06/2023)   [employer] of Occupational Health - Occupational Stress Questionnaire   . Feeling of Stress : Only a little  Social Connections: Socially Integrated (12/06/2023)   Social Connection and Isolation Panel   . Frequency of Communication with Friends and Family: More than three  times a week   . Frequency of Social Gatherings with Friends and Family: More than three times a week   . Attends Religious Services: More than 4 times per year   . Active Member of Clubs or Organizations: Yes   . Attends Banker Meetings: More than 4 times per year   . Marital Status: Married     Review of Systems     Objective:     BP 104/68   Pulse 75   Temp 98.2 F (36.8 C)   Resp 16   Ht 5' 7 (1.702 m)   Wt 241 lb (109.3 kg)   SpO2 98%   BMI 37.75 kg/m  Wt Readings from Last 3 Encounters:  03/07/24 241 lb (109.3 kg)  12/10/23 237 lb (107.5 kg)  11/26/23 235 lb (106.6 kg)    Physical Exam  {Perform Simple Foot Exam  Perform Detailed exam:1} {Insert foot Exam (Optional):30965}   Outpatient Encounter Medications as of 03/07/2024  Medication Sig  . allopurinol  (ZYLOPRIM ) 100 MG tablet Take 1 tablet (100 mg total) by mouth daily.  . amLODipine  (NORVASC ) 5 MG tablet TAKE 1 TABLET(5 MG) BY MOUTH DAILY  . busPIRone  (BUSPAR ) 5 MG tablet One q pm prn  . famotidine  (PEPCID ) 40 MG tablet TAKE 1 TABLET(40 MG) BY MOUTH DAILY  . finasteride  (PROSCAR ) 5 MG tablet Take 1 tablet (5 mg total) by mouth daily.  . fluticasone  (FLONASE ) 50 MCG/ACT nasal spray Place 2 sprays into both nostrils daily.  [name] ibuprofen (ADVIL,MOTRIN) 200 MG tablet Take by mouth.  . losartan -hydrochlorothiazide (HYZAAR) 100-25 MG tablet Take 1 tablet by mouth daily.  [name] MELATONIN PO Take by mouth.  . Misc Natural Products (HERBAL ENERGY COMPLEX PO) Take by mouth.  . Multiple Vitamin (MULTI-VITAMINS) TABS Take by mouth.  . rosuvastatin  (CRESTOR ) 10 MG tablet TAKE 1 TABLET(10 MG) BY MOUTH DAILY  . tadalafil  (CIALIS ) 20 MG tablet Take 1 tablet (20 mg total) by mouth daily as needed.  . tirzepatide  7.5 MG/0.5ML injection vial Inject 7.5 mg into the skin once a week.  . valACYclovir  (VALTREX ) 500 MG tablet TAKE 1 TABLET(500 MG) BY MOUTH DAILY AS NEEDED FOR COLD SORES  . [DISCONTINUED] metFORMIN   (GLUCOPHAGE -XR) 500 MG 24 hr tablet Take 1 tablet (500 mg total) by mouth daily.   No facility-administered encounter medications on file as of 03/07/2024.     Lab Results  Component Value Date   WBC 9.8 12/07/2023   HGB 14.1 12/07/2023   HCT 43.7 12/07/2023  PLT 310.0 12/07/2023   GLUCOSE 107 (H) 01/02/2024   CHOL 120 12/07/2023   TRIG 90.0 12/07/2023   HDL 42.80 12/07/2023   LDLDIRECT 123.7 10/07/2009   LDLCALC 60 12/07/2023   ALT 29 12/07/2023   AST 16 12/07/2023   NA 139 01/02/2024   K 4.2 01/02/2024   CL 102 01/02/2024   CREATININE 0.96 01/02/2024   BUN 21 01/02/2024   CO2 30 01/02/2024   TSH 1.75 12/07/2023   PSA 2.16 10/07/2009   HGBA1C 6.2 12/07/2023   MICROALBUR <0.7 02/25/2015    No results found.     Assessment & Plan:  There are no diagnoses linked to this encounter.   Dellar Fenton, MD

## 2024-03-08 ENCOUNTER — Encounter: Payer: Self-pay | Admitting: Internal Medicine

## 2024-03-08 NOTE — Assessment & Plan Note (Signed)
 Has adjusted diet. Lost weight. Continue crestor . Follow lipid panel.

## 2024-03-08 NOTE — Assessment & Plan Note (Signed)
 Has adjusted diet. Has lost weight. Taking zepbound  as outlined. Follow.

## 2024-03-08 NOTE — Assessment & Plan Note (Signed)
 Discussed increased acid reflux. Taking pepcid . Stop pepcid . Start protonix  40mg  q day. Follow call with update.

## 2024-03-08 NOTE — Assessment & Plan Note (Signed)
 On zepbound  5mg . Planning to increase to 7.5mg . discussed constipation. Discussed taking something regularly to keep bowels moving. Follow closely. Treat acid reflux.  Was having issues prior to starting zepbound . Change pepcid  to protonix . Follow. Call with update.

## 2024-03-08 NOTE — Assessment & Plan Note (Signed)
 Blood pressure as outlined on amlodipine  and losartan /hctz.  Follow pressures.

## 2024-03-18 DIAGNOSIS — H903 Sensorineural hearing loss, bilateral: Secondary | ICD-10-CM | POA: Diagnosis not present

## 2024-03-24 DIAGNOSIS — D485 Neoplasm of uncertain behavior of skin: Secondary | ICD-10-CM | POA: Diagnosis not present

## 2024-03-24 DIAGNOSIS — L570 Actinic keratosis: Secondary | ICD-10-CM | POA: Diagnosis not present

## 2024-03-26 NOTE — Telephone Encounter (Unsigned)
 Copied from CRM [id number]. Topic: Clinical - Medication Question >> Mar 26, 2024  1:08 PM Franky GRADE wrote: Reason for CRM: Patient would like to speak with Sueanne regarding a message he sent on 03/22/2024 requesting an increase in his zepound. He has not heard back.

## 2024-03-27 MED ORDER — TIRZEPATIDE-WEIGHT MANAGEMENT 10 MG/0.5ML ~~LOC~~ SOLN: 10.0000 mg | SUBCUTANEOUS | 2 refills | Status: DC

## 2024-03-27 NOTE — Telephone Encounter (Signed)
 Confirmed doing ok on 7.5 mg dose. Sent in 10 mg vial

## 2024-03-27 NOTE — Addendum Note (Signed)
 Addended by: LEARTA PORTO D on: 03/27/2024 08:50 AM   Modules accepted: Orders

## 2024-03-27 NOTE — Telephone Encounter (Signed)
 Please call him and confirm how he is doing on the 7.5mg  dose.  He is now been on 7.5mg  for four weeks. If doing ok, then I am ok to increase to 10mg  weekly.

## 2024-04-16 ENCOUNTER — Other Ambulatory Visit: Payer: Self-pay | Admitting: Internal Medicine

## 2024-04-16 ENCOUNTER — Telehealth: Payer: Self-pay

## 2024-04-16 MED ORDER — AMLODIPINE BESYLATE 5 MG PO TABS: ORAL_TABLET | ORAL | 3 refills | Status: AC

## 2024-04-16 NOTE — Telephone Encounter (Signed)
 Spoke to pt about a medication refill he stated that they use [facility] now. He would like [place] to give him a call about Zepbound . I did advise pt that she is gone for the day, but he stated that he wanted to talk to her.

## 2024-04-16 NOTE — Telephone Encounter (Signed)
 Copied from CRM #[id number]. Topic: Clinical - Medication Refill >> Apr 16, 2024 11:59 AM Laymon HERO wrote: Medication: amLODipine  (NORVASC ) 5 MG tablet  Has the patient contacted their pharmacy? Yes (Agent: If no, request that the patient contact the pharmacy for the refill. If patient does not wish to contact the pharmacy document the reason why and proceed with request.) (Agent: If yes, when and what did the pharmacy advise?)  This is the patient's preferred pharmacy:  [facility] - [place], [place] - [address] [place] [address] Phone: [phone] Fax: [phone]   Is this the correct pharmacy for this prescription? Yes If no, delete pharmacy and type the correct one.   Has the prescription been filled recently? Yes  Is the patient out of the medication? Yes  Has the patient been seen for an appointment in the last year OR does the patient have an upcoming appointment? Yes  Can we respond through MyChart? Yes  Agent: Please be advised that Rx refills may take up to 3 business days. We ask that you follow-up with your pharmacy.

## 2024-04-17 MED ORDER — TIRZEPATIDE-WEIGHT MANAGEMENT 12.5 MG/0.5ML ~~LOC~~ SOLN: 12.5000 mg | SUBCUTANEOUS | 2 refills | Status: DC

## 2024-04-17 NOTE — Telephone Encounter (Signed)
 Please confirm weight and any problems with medication?  Also, need to make sure has a f/u appt scheduled with me.  If tolerating, ok to refill the next dose (12.5mg ).

## 2024-04-17 NOTE — Telephone Encounter (Signed)
 Rx sent in for 12.5mg  zepbound .

## 2024-04-17 NOTE — Telephone Encounter (Signed)
 Spoke with pt. Pt confirmed no GI issues. Pt current wt is 227.3lb

## 2024-04-17 NOTE — Telephone Encounter (Signed)
 Patient said that he would like to increase his zepbound  to 12.5. He is currently on 10 mg and says he is tolerating well. He is not completely out of the 10 mg but would like next refill to be for next dose. [facility] Ok to send in?

## 2024-04-17 NOTE — Addendum Note (Signed)
 Addended by: Filomena Pokorney on: 04/17/2024 03:32 PM   Modules accepted: Orders

## 2024-04-22 DIAGNOSIS — L821 Other seborrheic keratosis: Secondary | ICD-10-CM | POA: Diagnosis not present

## 2024-04-22 DIAGNOSIS — L570 Actinic keratosis: Secondary | ICD-10-CM | POA: Diagnosis not present

## 2024-05-05 ENCOUNTER — Ambulatory Visit: Admitting: Internal Medicine

## 2024-05-28 ENCOUNTER — Telehealth: Payer: Self-pay | Admitting: Internal Medicine

## 2024-05-28 NOTE — Telephone Encounter (Unsigned)
 Copied from CRM #[id number]. Topic: Clinical - Medication Question >> May 28, 2024 10:26 AM Aleatha C wrote: Reason for CRM: wants Zepound  to move up to 15 for next refilll

## 2024-05-28 NOTE — Telephone Encounter (Signed)
 Medication not pended, pt requesting increased dosage.

## 2024-05-28 NOTE — Telephone Encounter (Signed)
 Copied from CRM #[id number]. Topic: Clinical - Medication Refill >> May 28, 2024 10:24 AM Aleatha C wrote: Medication: zepound  Has the patient contacted their pharmacy? No (Agent: If no, request that the patient contact the pharmacy for the refill. If patient does not wish to contact the pharmacy document the reason why and proceed with request.) (Agent: If yes, when and what did the pharmacy advise?)  This is the patient's preferred pharmacy:     [facility] [place], [place] - [phone] [place] [address] Phone: [phone] Fax: [phone]  Is this the correct pharmacy for this prescription? Yes If no, delete pharmacy and type the correct one.   Has the prescription been filled recently? No  Is the patient out of the medication? No  Has the patient been seen for an appointment in the last year OR does the patient have an upcoming appointment? Yes  Can we respond through MyChart? No  Agent: Please be advised that Rx refills may take up to 3 business days. We ask that you follow-up with your pharmacy.

## 2024-05-28 NOTE — Telephone Encounter (Signed)
 Patient is requesting to increase his zepbound  to 15 mg. He has been on 12.5mg  for almost 8 weeks and tolerating well. He would like to increase with his next refill because it is almost time for him to order again. OK to send in 15 mg vials to [facility] for him?

## 2024-05-29 MED ORDER — ZEPBOUND 15 MG/0.5ML ~~LOC~~ SOLN: 15.0000 mg | SUBCUTANEOUS | 2 refills | Status: DC

## 2024-05-29 NOTE — Telephone Encounter (Signed)
 Given doing well, ok to send in 15mg . Keep scheduled follow up appt. Let us  know if any problems.

## 2024-05-29 NOTE — Telephone Encounter (Signed)
 15 mg sent to [facility]. Patient is aware.

## 2024-06-18 ENCOUNTER — Ambulatory Visit: Admitting: Internal Medicine

## 2024-06-18 NOTE — Progress Notes (Signed)
 Scott Crawford                                          MRN: [id number]   06/18/2024   The [facility] Quality Team Specialist reviewed this patient medical record for the purposes of chart review for care gap closure. The following were reviewed: chart review for care gap closure-kidney health evaluation for diabetes:eGFR  and uACR.    VBCI Quality Team

## 2024-07-14 ENCOUNTER — Other Ambulatory Visit: Payer: Self-pay | Admitting: Internal Medicine

## 2024-07-28 ENCOUNTER — Other Ambulatory Visit: Payer: Self-pay | Admitting: Internal Medicine

## 2024-07-28 NOTE — Telephone Encounter (Unsigned)
 Copied from CRM [id number]. Topic: Clinical - Medication Refill >> Jul 28, 2024 10:15 AM Scott Crawford wrote: Medication: Tirzepatide -Weight Management (ZEPBOUND ) 15 MG/0.5ML SOLN [[id number]] stated his nurse handles is medication   Has the patient contacted their pharmacy? Yes (Agent: If no, request that the patient contact the pharmacy for the refill. If patient does not wish to contact the pharmacy document the reason why and proceed with request.) (Agent: If yes, when and what did the pharmacy advise?)  This is the patient's preferred pharmacy:   [facility] [place], [place] - [address] [phone] [place] [address] Phone: [phone] Fax: [phone]  Is this the correct pharmacy for this prescription? Yes If no, delete pharmacy and type the correct one.   Has the prescription been filled recently? No  Is the patient out of the medication? No few weeks left  Has the patient been seen for an appointment in the last year OR does the patient have an upcoming appointment? Yes  Can we respond through MyChart? No call  Agent: Please be advised that Rx refills may take up to 3 business days. We ask that you follow-up with your pharmacy.

## 2024-07-29 MED ORDER — ZEPBOUND 15 MG/0.5ML ~~LOC~~ SOLN: 15.0000 mg | SUBCUTANEOUS | 2 refills | Status: DC

## 2024-09-01 DIAGNOSIS — H903 Sensorineural hearing loss, bilateral: Secondary | ICD-10-CM | POA: Diagnosis not present

## 2024-09-08 ENCOUNTER — Other Ambulatory Visit

## 2024-09-08 DIAGNOSIS — I10 Essential (primary) hypertension: Secondary | ICD-10-CM | POA: Diagnosis not present

## 2024-09-08 DIAGNOSIS — E7800 Pure hypercholesterolemia, unspecified: Secondary | ICD-10-CM | POA: Diagnosis not present

## 2024-09-08 DIAGNOSIS — R739 Hyperglycemia, unspecified: Secondary | ICD-10-CM | POA: Diagnosis not present

## 2024-09-08 LAB — LIPID PANEL
Cholesterol: 107 mg/dL (ref 0–200)
HDL: 44 mg/dL (ref >39.00)
LDL Cholesterol: 51 mg/dL (ref 0–99)
NonHDL: 62.69
Total CHOL/HDL Ratio: 2
Triglycerides: 60 mg/dL (ref 0.0–149.0)
VLDL: 12 mg/dL (ref 0.0–40.0)

## 2024-09-08 LAB — HEPATIC FUNCTION PANEL
ALT: 9 U/L (ref 0–53)
AST: 11 U/L (ref 0–37)
Albumin: 4.1 g/dL (ref 3.5–5.2)
Alkaline Phosphatase: 50 U/L (ref 39–117)
Bilirubin, Direct: 0.1 mg/dL (ref 0.0–0.3)
Total Bilirubin: 0.5 mg/dL (ref 0.2–1.2)
Total Protein: 6 g/dL (ref 6.0–8.3)

## 2024-09-08 LAB — CBC WITH DIFFERENTIAL/PLATELET
Basophils Absolute: 0 K/uL (ref 0.0–0.1)
Basophils Relative: 0.6 % (ref 0.0–3.0)
Eosinophils Absolute: 0.2 K/uL (ref 0.0–0.7)
Eosinophils Relative: 4 % (ref 0.0–5.0)
HCT: 40.5 % (ref 39.0–52.0)
Hemoglobin: 13.6 g/dL (ref 13.0–17.0)
Lymphocytes Relative: 21.7 % (ref 12.0–46.0)
Lymphs Abs: 1.3 K/uL (ref 0.7–4.0)
MCHC: 33.5 g/dL (ref 30.0–36.0)
MCV: 86 fl (ref 78.0–100.0)
Monocytes Absolute: 0.5 K/uL (ref 0.1–1.0)
Monocytes Relative: 8.2 % (ref 3.0–12.0)
Neutro Abs: 3.9 K/uL (ref 1.4–7.7)
Neutrophils Relative %: 65.5 % (ref 43.0–77.0)
Platelets: 204 K/uL (ref 150.0–400.0)
RBC: 4.71 Mil/uL (ref 4.22–5.81)
RDW: 15.7 % — ABNORMAL HIGH (ref 11.5–15.5)
WBC: 5.9 K/uL (ref 4.0–10.5)

## 2024-09-08 LAB — HEMOGLOBIN A1C: Hgb A1c MFr Bld: 5 % (ref 4.6–6.5)

## 2024-09-08 LAB — TSH: TSH: 2.03 u[IU]/mL (ref 0.35–5.50)

## 2024-09-09 ENCOUNTER — Ambulatory Visit: Payer: Self-pay | Admitting: Internal Medicine

## 2024-09-10 ENCOUNTER — Encounter: Payer: Self-pay | Admitting: Internal Medicine

## 2024-09-10 ENCOUNTER — Ambulatory Visit: Admitting: Internal Medicine

## 2024-09-10 ENCOUNTER — Ambulatory Visit (INDEPENDENT_AMBULATORY_CARE_PROVIDER_SITE_OTHER): Admitting: Internal Medicine

## 2024-09-10 VITALS — BP 118/64 | HR 75 | Temp 97.6°F | Ht 67.0 in | Wt 188.6 lb

## 2024-09-10 DIAGNOSIS — E7800 Pure hypercholesterolemia, unspecified: Secondary | ICD-10-CM | POA: Diagnosis not present

## 2024-09-10 DIAGNOSIS — Z713 Dietary counseling and surveillance: Secondary | ICD-10-CM

## 2024-09-10 DIAGNOSIS — I10 Essential (primary) hypertension: Secondary | ICD-10-CM

## 2024-09-10 DIAGNOSIS — K219 Gastro-esophageal reflux disease without esophagitis: Secondary | ICD-10-CM | POA: Diagnosis not present

## 2024-09-10 DIAGNOSIS — R739 Hyperglycemia, unspecified: Secondary | ICD-10-CM | POA: Diagnosis not present

## 2024-09-10 DIAGNOSIS — E559 Vitamin D deficiency, unspecified: Secondary | ICD-10-CM

## 2024-09-10 DIAGNOSIS — Z800 Family history of malignant neoplasm of digestive organs: Secondary | ICD-10-CM

## 2024-09-10 MED ORDER — ZEPBOUND 15 MG/0.5ML ~~LOC~~ SOLN: 15.0000 mg | SUBCUTANEOUS | 3 refills | Status: DC

## 2024-09-10 MED ORDER — PANTOPRAZOLE SODIUM 20 MG PO TBEC: 20.0000 mg | DELAYED_RELEASE_TABLET | Freq: Every day | ORAL | 3 refills | Status: AC

## 2024-09-10 NOTE — Progress Notes (Signed)
 "  Subjective:    Patient ID: Scott Crawford, male    DOB: [birth year], 68 y.o.   MRN: [id number]  Patient here for  Chief Complaint  Patient presents with   Medical Management of Chronic Issues   Weight Check    HPI Here for a scheduled follow up - follow up regarding weight loss - on zepbound . He reports doing well with the medication. Has lost weight. Eating prunes. Keeping bowels moving. Off metformin . Recent A1c 5.0. exercising. No hip pain. No chest pain or sob reported. Previously noticed some light headedness - bending over. Stopped hyzaar. Blood pressures avaraging 115/65-70 (highest) 120 systolic. Discussed decreasing protonix  to 20mg  q day.    Past Medical History:  Diagnosis Date   Allergy    Anxiety    Arthritis    Cataract    DDD (degenerative disc disease), lumbar    Elevated PSA    Followed by Dr. Ike   Epididymal cyst    GERD (gastroesophageal reflux disease)    H/O gastroesophageal reflux (GERD)    Herpes simplex    History of hiatal hernia    Hyperlipidemia    Hypertension    IBS (irritable bowel syndrome)    Liver abscess    Lumbar back pain    ruptured L5   Motion sickness    deep sea fishing   Obesity    Panic disorder without agoraphobia    Wears hearing aid in both ears    Past Surgical History:  Procedure Laterality Date   CATARACT EXTRACTION W/PHACO Right 09/10/2017   Procedure: CATARACT EXTRACTION PHACO AND INTRAOCULAR LENS PLACEMENT (IOC)  RIGHT;  Surgeon: Mittie Gaskin, MD;  Location: [facility];  Service: Ophthalmology;  Laterality: Right;   COLONOSCOPY     COLONOSCOPY WITH PROPOFOL  N/A 06/21/2015   Procedure: COLONOSCOPY WITH PROPOFOL ;  Surgeon: Deward CINDERELLA Piedmont, MD;  Location: [facility];  Service: Gastroenterology;  Laterality: N/A;   COLONOSCOPY WITH PROPOFOL  N/A 09/10/2020   Procedure: COLONOSCOPY WITH PROPOFOL ;  Surgeon: Jinny Carmine, MD;  Location: [facility];  Service: Endoscopy;  Laterality: N/A;    ESOPHAGOGASTRODUODENOSCOPY     EYE SURGERY     LIVER CYST REMOVAL     fusobacterium, 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 Ike, negative/inconclusive   TONSILLECTOMY  09/25/1960   child   Family History  Problem Relation Age of Onset   Cancer Mother        Thyroid  cancer   Hyperlipidemia Father    Dementia Father    Cancer Brother        colon   Cancer Maternal Grandmother        leukemia   Cancer Maternal Grandfather        colon, treated with surgical resection   Cancer Paternal Grandfather    Cancer Paternal Grandmother    Social History   Socioeconomic History   Marital status: Married    Spouse name: Not on file   Number of children: Not on file   Years of education: Not on file   Highest education level: Bachelor's degree (e.g., BA, AB, BS)  Occupational History   Not on file  Tobacco Use   Smoking status: Never   Smokeless tobacco: Never  Vaping Use   Vaping status: Never Used  Substance and Sexual Activity   Alcohol use: Yes    Comment: rare   Drug use: No   Sexual activity: Yes  Other Topics Concern  Not on file  Social History Narrative   Lives in [place] with wife. One daughter.      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 Worked for [employer]       Diet - regular      Exercise - limited   Social Drivers of Health   Tobacco Use: Low Risk (09/19/2024)   Patient History    Smoking Tobacco Use: Never    Smokeless Tobacco Use: Never    Passive Exposure: Not on file  Financial Resource Strain: Low Risk (09/09/2024)   Overall Financial Resource Strain (CARDIA)    Difficulty of Paying Living Expenses: Not hard at all  Food Insecurity: No Food Insecurity (09/09/2024)   Epic    Worried About Programme Researcher, Broadcasting/film/video in the Last Year: Never true    Ran Out of Food in the Last Year: Never true  Transportation Needs: No Transportation Needs (09/09/2024)   Epic    Lack of Transportation (Medical): No    Lack of Transportation  (Non-Medical): No  Physical Activity: Insufficiently Active (09/09/2024)   Exercise Vital Sign    Days of Exercise per Week: 3 days    Minutes of Exercise per Session: 30 min  Stress: No Stress Concern Present (09/09/2024)   [employer] of Occupational Health - Occupational Stress Questionnaire    Feeling of Stress: Not at all  Social Connections: Socially Integrated (09/09/2024)   Social Connection and Isolation Panel    Frequency of Communication with Friends and Family: More than three times a week    Frequency of Social Gatherings with Friends and Family: More than three times a week    Attends Religious Services: More than 4 times per year    Active Member of Clubs or Organizations: Yes    Attends Banker Meetings: More than 4 times per year    Marital Status: Married  Depression (PHQ2-9): Low Risk (09/10/2024)   Depression (PHQ2-9)    PHQ-2 Score: 0  Alcohol Screen: Low Risk (09/09/2024)   Alcohol Screen    Last Alcohol Screening Score (AUDIT): 1  Housing: Low Risk (09/09/2024)   Epic    Unable to Pay for Housing in the Last Year: No    Number of Times Moved in the Last Year: 0    Homeless in the Last Year: No  Utilities: Not on file  Health Literacy: Not on file     Review of Systems  Constitutional:  Negative for appetite change and unexpected weight change.       Has done with with GLP 1. Has adjusted diet. Has lost weight.   HENT:  Negative for congestion and sinus pressure.   Respiratory:  Negative for cough, chest tightness and shortness of breath.   Cardiovascular:  Negative for chest pain, palpitations and leg swelling.  Gastrointestinal:  Negative for abdominal pain, diarrhea, nausea and vomiting.  Genitourinary:  Negative for difficulty urinating and dysuria.  Musculoskeletal:  Negative for joint swelling and myalgias.  Skin:  Negative for color change and rash.  Neurological:  Negative for dizziness and headaches.  Psychiatric/Behavioral:   Negative for agitation and dysphoric mood.        Objective:     BP 118/64   Pulse 75   Temp 97.6 F (36.4 C) (Oral)   Ht 5' 7 (1.702 m)   Wt 188 lb 9.6 oz (85.5 kg)   SpO2 100%   BMI 29.54 kg/m  Wt Readings from Last 3 Encounters:  09/10/24 188 lb 9.6 oz (85.5 kg)  03/07/24 241 lb (109.3 kg)  12/10/23 237 lb (107.5 kg)    Physical Exam Vitals reviewed.  Constitutional:      General: He is not in acute distress.    Appearance: Normal appearance. He is well-developed.  HENT:     Head: Normocephalic and atraumatic.     Right Ear: External ear normal.     Left Ear: External ear normal.     Mouth/Throat:     Pharynx: No oropharyngeal exudate or posterior oropharyngeal erythema.  Eyes:     General: No scleral icterus.       Right eye: No discharge.        Left eye: No discharge.     Conjunctiva/sclera: Conjunctivae normal.  Cardiovascular:     Rate and Rhythm: Normal rate and regular rhythm.  Pulmonary:     Effort: Pulmonary effort is normal. No respiratory distress.     Breath sounds: Normal breath sounds.  Abdominal:     General: Bowel sounds are normal.     Palpations: Abdomen is soft.     Tenderness: There is no abdominal tenderness.  Musculoskeletal:        General: No swelling or tenderness.     Cervical back: Neck supple. No tenderness.  Lymphadenopathy:     Cervical: No cervical adenopathy.  Skin:    Findings: No erythema or rash.  Neurological:     Mental Status: He is alert.  Psychiatric:        Mood and Affect: Mood normal.        Behavior: Behavior normal.         Outpatient Encounter Medications as of 09/10/2024  Medication Sig   allopurinol  (ZYLOPRIM ) 100 MG tablet Take 1 tablet (100 mg total) by mouth daily.   amLODipine  (NORVASC ) 5 MG tablet TAKE 1 TABLET(5 MG) BY MOUTH DAILY   busPIRone  (BUSPAR ) 5 MG tablet One q pm prn   finasteride  (PROSCAR ) 5 MG tablet Take 1 tablet (5 mg total) by mouth daily.   fluticasone  (FLONASE ) 50 MCG/ACT  nasal spray Place 2 sprays into both nostrils daily.   ibuprofen (ADVIL,MOTRIN) 200 MG tablet Take by mouth.   MELATONIN PO Take by mouth.   Misc Natural Products (HERBAL ENERGY COMPLEX PO) Take by mouth.   Multiple Vitamin (MULTI-VITAMINS) TABS Take by mouth.   pantoprazole  (PROTONIX ) 20 MG tablet Take 1 tablet (20 mg total) by mouth daily.   Probiotic Product (PROBIOTIC DAILY PO) Take by mouth daily.   rosuvastatin  (CRESTOR ) 10 MG tablet TAKE 1 TABLET(10 MG) BY MOUTH DAILY   tadalafil  (CIALIS ) 20 MG tablet Take 1 tablet (20 mg total) by mouth daily as needed.   valACYclovir  (VALTREX ) 500 MG tablet TAKE 1 TABLET(500 MG) BY MOUTH DAILY AS NEEDED FOR COLD SORES   [DISCONTINUED] famotidine  (PEPCID ) 40 MG tablet Take 40 mg by mouth daily.   [DISCONTINUED] pantoprazole  (PROTONIX ) 40 MG tablet TAKE (1) TABLET BY MOUTH EVERY DAY   [DISCONTINUED] Tirzepatide -Weight Management (ZEPBOUND ) 15 MG/0.5ML SOLN Inject 15 mg into the skin once a week.   Tirzepatide -Weight Management (ZEPBOUND ) 15 MG/0.5ML SOLN Inject 15 mg into the skin once a week.   [DISCONTINUED] losartan -hydrochlorothiazide (HYZAAR) 100-25 MG tablet Take 1 tablet by mouth daily. (Patient not taking: Reported on 09/10/2024)   No facility-administered encounter medications on file as of 09/10/2024.     Lab Results  Component Value Date   WBC 5.9 09/08/2024   HGB 13.6 09/08/2024  HCT 40.5 09/08/2024   PLT 204.0 09/08/2024   GLUCOSE 107 (H) 01/02/2024   CHOL 107 09/08/2024   TRIG 60.0 09/08/2024   HDL 44.00 09/08/2024   LDLDIRECT 123.7 10/07/2009   LDLCALC 51 09/08/2024   ALT 9 09/08/2024   AST 11 09/08/2024   NA 139 01/02/2024   K 4.2 01/02/2024   CL 102 01/02/2024   CREATININE 0.96 01/02/2024   BUN 21 01/02/2024   CO2 30 01/02/2024   TSH 2.03 09/08/2024   PSA 2.16 10/07/2009   HGBA1C 5.0 09/08/2024       Assessment & Plan:  Essential hypertension, benign Assessment & Plan: Blood pressure as outlined on amlodipine .  Off losartan /hydrochlorothiazide. Blood pressure doing well. Follow pressures. Follow metabolic panel.   Orders: -     CBC with Differential/Platelet; Future -     Hemoglobin A1c; Future -     Hepatic function panel; Future -     Lipid panel; Future  Pure hypercholesterolemia Assessment & Plan: Continue crestor . Low cholesterol diet and exercise. Follow lipid panel.   Orders: -     CBC with Differential/Platelet; Future -     Hemoglobin A1c; Future -     Hepatic function panel; Future -     Lipid panel; Future  Hyperglycemia Assessment & Plan: Continue zepbound . Has adjusted his diet. Lost weight. Follow met b and A1c.   Orders: -     CBC with Differential/Platelet; Future -     Hemoglobin A1c; Future -     Hepatic function panel; Future -     Lipid panel; Future  Weight loss counseling, encounter for Assessment & Plan: Continues on zepbound . Has done well on the medication. Has lost weight. Adjusted his diet. Stays active. Follow.    Vitamin D  deficiency Assessment & Plan: Check vitamin D  level with next labs.   Orders: -     VITAMIN D  25 Hydroxy (Vit-D Deficiency, Fractures); Future  Gastroesophageal reflux disease without esophagitis Assessment & Plan: On protonix  40mg  q day. Has lost weight. No upper symptoms reported. Decrease protonix  to 20mg  q day. Follow.    Family history of colon cancer Assessment & Plan: Colonoscopy 08/2020.  Recommended f/u in 5 years.    Other orders -     Pantoprazole  Sodium; Take 1 tablet (20 mg total) by mouth daily.  Dispense: 90 tablet; Refill: 3 -     Zepbound ; Inject 15 mg into the skin once a week.  Dispense: 2 mL; Refill: 3     Allena Hamilton, MD "

## 2024-09-19 ENCOUNTER — Encounter: Payer: Self-pay | Admitting: Internal Medicine

## 2024-09-19 NOTE — Assessment & Plan Note (Signed)
 Continues on zepbound . Has done well on the medication. Has lost weight. Adjusted his diet. Stays active. Follow.

## 2024-09-19 NOTE — Assessment & Plan Note (Addendum)
 Blood pressure as outlined on amlodipine . Off losartan /hydrochlorothiazide. Blood pressure doing well. Follow pressures. Follow metabolic panel.

## 2024-09-19 NOTE — Assessment & Plan Note (Signed)
Continue crestor.  Low cholesterol diet and exercise.  Follow lipid panel.  

## 2024-09-19 NOTE — Assessment & Plan Note (Signed)
 Continue zepbound . Has adjusted his diet. Lost weight. Follow met b and A1c.

## 2024-09-19 NOTE — Assessment & Plan Note (Signed)
 On protonix  40mg  q day. Has lost weight. No upper symptoms reported. Decrease protonix  to 20mg  q day. Follow.

## 2024-09-19 NOTE — Assessment & Plan Note (Signed)
Colonoscopy 08/2020.  Recommended f/u in 5 years.  

## 2024-09-19 NOTE — Assessment & Plan Note (Signed)
 Check vitamin D level with next labs.  ?

## 2024-10-07 ENCOUNTER — Other Ambulatory Visit: Payer: Self-pay | Admitting: Internal Medicine

## 2024-10-31 ENCOUNTER — Telehealth: Payer: Self-pay

## 2024-10-31 MED ORDER — ZEPBOUND 15 MG/0.5ML ~~LOC~~ SOLN: 15.0000 mg | SUBCUTANEOUS | 2 refills | Status: AC

## 2024-10-31 NOTE — Telephone Encounter (Signed)
 Lvm for pt to return call. Okay to relay. Prescription has refills on fill. Need to confirm if pt reached put to [facility] to have refilled.

## 2024-10-31 NOTE — Telephone Encounter (Signed)
 Spoke to pt. Pt notified refill sent with refills

## 2024-10-31 NOTE — Telephone Encounter (Signed)
 Copied from CRM #[id number]. Topic: Clinical - Medication Refill >> Oct 30, 2024  5:13 PM Alfonso HERO wrote: Medication: Tirzepatide -Weight Management (ZEPBOUND ) 15 MG/0.5ML SOLN  Has the patient contacted their pharmacy? Yes (Agent: If no, request that the patient contact the pharmacy for the refill. If patient does not wish to contact the pharmacy document the reason why and proceed with request.) (Agent: If yes, when and what did the pharmacy advise?)  This is the patient's preferred pharmacy:   [facility] [place], [place] - [phone] [place] [address] Phone: [phone] Fax: [phone]  Is this the correct pharmacy for this prescription? Yes If no, delete pharmacy and type the correct one.   Has the prescription been filled recently? Yes  Is the patient out of the medication? Yes  Has the patient been seen for an appointment in the last year OR does the patient have an upcoming appointment? Yes  Can we respond through MyChart? Yes  Agent: Please be advised that Rx refills may take up to 3 business days. We ask that you follow-up with your pharmacy.

## 2024-10-31 NOTE — Telephone Encounter (Signed)
 Copied from CRM #[id number]. Topic: Clinical - Medication Refill >> Oct 31, 2024  2:41 PM China J wrote: Patient is returning a call from Sena Clouatre. He said that [facility] does not have any refills for him. He was also wondering if there would be a chance to receive 6 months of refills or have automatic refills for the next 6 months to avoid having to go back and forth about this medication.   He would like a call back for an update of everything at [phone]

## 2024-11-14 ENCOUNTER — Ambulatory Visit: Payer: Medicare PPO | Admitting: Urology

## 2024-11-18 ENCOUNTER — Ambulatory Visit: Admitting: Urology

## 2025-03-11 ENCOUNTER — Other Ambulatory Visit

## 2025-03-13 ENCOUNTER — Encounter: Admitting: Internal Medicine
